# Patient Record
Sex: Male | Born: 1960 | Race: White | Hispanic: No | Marital: Married | Smoking: Former smoker
Health system: Southern US, Community
[De-identification: ages and names within clinical notes are randomized; demographics above are authoritative.]

## PROBLEM LIST (undated history)

## (undated) DIAGNOSIS — M1990 Unspecified osteoarthritis, unspecified site: Secondary | ICD-10-CM

## (undated) DIAGNOSIS — Z9989 Dependence on other enabling machines and devices: Secondary | ICD-10-CM

## (undated) DIAGNOSIS — G4733 Obstructive sleep apnea (adult) (pediatric): Secondary | ICD-10-CM

## (undated) DIAGNOSIS — F99 Mental disorder, not otherwise specified: Secondary | ICD-10-CM

## (undated) DIAGNOSIS — F32A Depression, unspecified: Secondary | ICD-10-CM

## (undated) DIAGNOSIS — K589 Irritable bowel syndrome without diarrhea: Secondary | ICD-10-CM

## (undated) DIAGNOSIS — G7000 Myasthenia gravis without (acute) exacerbation: Secondary | ICD-10-CM

## (undated) DIAGNOSIS — I10 Essential (primary) hypertension: Secondary | ICD-10-CM

## (undated) DIAGNOSIS — R5382 Chronic fatigue, unspecified: Secondary | ICD-10-CM

## (undated) DIAGNOSIS — M109 Gout, unspecified: Secondary | ICD-10-CM

## (undated) DIAGNOSIS — F329 Major depressive disorder, single episode, unspecified: Secondary | ICD-10-CM

## (undated) DIAGNOSIS — R413 Other amnesia: Secondary | ICD-10-CM

## (undated) DIAGNOSIS — K219 Gastro-esophageal reflux disease without esophagitis: Secondary | ICD-10-CM

## (undated) HISTORY — PX: CARPAL TUNNEL RELEASE: SHX101

## (undated) HISTORY — PX: OTHER SURGICAL HISTORY: SHX169

## (undated) HISTORY — DX: Chronic fatigue, unspecified: R53.82

## (undated) HISTORY — DX: Essential (primary) hypertension: I10

## (undated) HISTORY — DX: Depression, unspecified: F32.A

## (undated) HISTORY — DX: Other amnesia: R41.3

## (undated) HISTORY — DX: Unspecified osteoarthritis, unspecified site: M19.90

## (undated) HISTORY — DX: Irritable bowel syndrome, unspecified: K58.9

## (undated) HISTORY — DX: Mental disorder, not otherwise specified: F99

## (undated) HISTORY — DX: Gastro-esophageal reflux disease without esophagitis: K21.9

## (undated) HISTORY — DX: Major depressive disorder, single episode, unspecified: F32.9

---

## 1999-02-16 ENCOUNTER — Ambulatory Visit: Admission: RE | Admit: 1999-02-16 | Discharge: 1999-02-16 | Payer: Self-pay | Admitting: General Practice

## 2001-02-06 ENCOUNTER — Emergency Department (HOSPITAL_COMMUNITY): Admission: EM | Admit: 2001-02-06 | Discharge: 2001-02-06 | Payer: Self-pay | Admitting: Emergency Medicine

## 2001-02-06 ENCOUNTER — Encounter: Payer: Self-pay | Admitting: Emergency Medicine

## 2001-08-20 ENCOUNTER — Encounter: Admission: RE | Admit: 2001-08-20 | Discharge: 2001-11-18 | Payer: Self-pay | Admitting: General Practice

## 2001-09-24 ENCOUNTER — Ambulatory Visit (HOSPITAL_BASED_OUTPATIENT_CLINIC_OR_DEPARTMENT_OTHER): Admission: RE | Admit: 2001-09-24 | Discharge: 2001-09-24 | Payer: Self-pay | Admitting: Orthopedic Surgery

## 2001-09-28 ENCOUNTER — Emergency Department (HOSPITAL_COMMUNITY): Admission: EM | Admit: 2001-09-28 | Discharge: 2001-09-29 | Payer: Self-pay | Admitting: Emergency Medicine

## 2001-09-29 ENCOUNTER — Encounter: Payer: Self-pay | Admitting: Orthopedic Surgery

## 2002-12-04 ENCOUNTER — Encounter: Payer: Self-pay | Admitting: Orthopedic Surgery

## 2002-12-09 ENCOUNTER — Inpatient Hospital Stay (HOSPITAL_COMMUNITY): Admission: RE | Admit: 2002-12-09 | Discharge: 2002-12-12 | Payer: Self-pay | Admitting: Orthopedic Surgery

## 2004-09-09 ENCOUNTER — Inpatient Hospital Stay (HOSPITAL_COMMUNITY): Admission: EM | Admit: 2004-09-09 | Discharge: 2004-09-12 | Payer: Self-pay | Admitting: Emergency Medicine

## 2004-10-06 ENCOUNTER — Observation Stay (HOSPITAL_COMMUNITY): Admission: EM | Admit: 2004-10-06 | Discharge: 2004-10-07 | Payer: Self-pay | Admitting: Emergency Medicine

## 2010-01-25 ENCOUNTER — Observation Stay (HOSPITAL_COMMUNITY): Admission: EM | Admit: 2010-01-25 | Discharge: 2010-01-27 | Payer: Self-pay | Admitting: Emergency Medicine

## 2010-01-26 ENCOUNTER — Ambulatory Visit: Payer: Self-pay | Admitting: Cardiology

## 2010-01-26 ENCOUNTER — Encounter (INDEPENDENT_AMBULATORY_CARE_PROVIDER_SITE_OTHER): Payer: Self-pay | Admitting: Emergency Medicine

## 2010-03-23 ENCOUNTER — Encounter: Admission: RE | Admit: 2010-03-23 | Discharge: 2010-03-23 | Payer: Self-pay | Admitting: *Deleted

## 2010-09-08 LAB — PROTIME-INR
INR: 0.98 (ref 0.00–1.49)
Prothrombin Time: 13.2 seconds (ref 11.6–15.2)

## 2010-09-08 LAB — CK TOTAL AND CKMB (NOT AT ARMC)
CK, MB: 4 ng/mL (ref 0.3–4.0)
CK, MB: 4 ng/mL (ref 0.3–4.0)
CK, MB: 4.2 ng/mL — ABNORMAL HIGH (ref 0.3–4.0)
Relative Index: 2 (ref 0.0–2.5)
Relative Index: 2.3 (ref 0.0–2.5)
Total CK: 209 U/L (ref 7–232)

## 2010-09-08 LAB — POCT CARDIAC MARKERS
CKMB, poc: 3.1 ng/mL (ref 1.0–8.0)
Myoglobin, poc: 121 ng/mL (ref 12–200)
Troponin i, poc: <0.05 ng/mL (ref 0.00–0.09)
Troponin i, poc: <0.05 ng/mL (ref 0.00–0.09)

## 2010-09-08 LAB — DIFFERENTIAL
Basophils Relative: 0 % (ref 0–1)
Lymphocytes Relative: 13 % (ref 12–46)
Monocytes Absolute: 1 10*3/uL (ref 0.1–1.0)
Monocytes Relative: 7 % (ref 3–12)
Neutro Abs: 10.9 10*3/uL — ABNORMAL HIGH (ref 1.7–7.7)
Neutrophils Relative %: 80 % — ABNORMAL HIGH (ref 43–77)

## 2010-09-08 LAB — BASIC METABOLIC PANEL
BUN: 18 mg/dL (ref 6–23)
Calcium: 8.6 mg/dL (ref 8.4–10.5)
Chloride: 106 mEq/L (ref 96–112)
GFR calc non Af Amer: >60 mL/min (ref >60)
Glucose, Bld: 90 mg/dL (ref 70–99)

## 2010-09-08 LAB — CBC
HCT: 38.2 % — ABNORMAL LOW (ref 39.0–52.0)
Hemoglobin: 13 g/dL (ref 13.0–17.0)
MCH: 26 pg (ref 26.0–34.0)
MCH: 27.3 pg (ref 26.0–34.0)
MCHC: 31.9 g/dL (ref 30.0–36.0)
MCHC: 34 g/dL (ref 30.0–36.0)
MCV: 80.1 fL (ref 78.0–100.0)
Platelets: 161 10*3/uL (ref 150–400)

## 2010-09-08 LAB — COMPREHENSIVE METABOLIC PANEL
AST: 28 U/L (ref 0–37)
Alkaline Phosphatase: 63 U/L (ref 39–117)
CO2: 24 mEq/L (ref 19–32)
Calcium: 8.9 mg/dL (ref 8.4–10.5)
Chloride: 108 mEq/L (ref 96–112)
Creatinine, Ser: 0.99 mg/dL (ref 0.4–1.5)
GFR calc Af Amer: >60 mL/min (ref >60)
Glucose, Bld: 93 mg/dL (ref 70–99)
Total Bilirubin: 0.7 mg/dL (ref 0.3–1.2)

## 2010-09-08 LAB — URINALYSIS, ROUTINE W REFLEX MICROSCOPIC
Hgb urine dipstick: NEGATIVE
Specific Gravity, Urine: 1.022 (ref 1.005–1.030)
pH: 6 (ref 5.0–8.0)

## 2010-09-08 LAB — LIPASE, BLOOD: Lipase: 24 U/L (ref 11–59)

## 2010-09-08 LAB — D-DIMER, QUANTITATIVE: D-Dimer, Quant: 0.29 ug/mL-FEU (ref 0.00–0.48)

## 2010-11-10 NOTE — Op Note (Signed)
NAME:  Anthony Carlson, Anthony Carlson                            ACCOUNT NO.:  [id number]   MEDICAL RECORD NO.:  [id number]                   PATIENT TYPE:  INP   LOCATION:  5024                                 FACILITY:  [facility]   PHYSICIAN:  Harvie Junior, M.D.                DATE OF BIRTH:  June 11, 1961   DATE OF PROCEDURE:  DATE OF DISCHARGE:                                 OPERATIVE REPORT   PREOPERATIVE DIAGNOSIS:  End stage degenerative joint disease, right ankle.   POSTOPERATIVE DIAGNOSIS:  End stage degenerative joint disease, right ankle.   OPERATION PERFORMED:  Fusion of right ankle with a lateral exposure, bone  grafting and plating laterally.   SURGEON:  Harvie Junior, M.D.   ASSISTANT:  Marshia Ly, P.A.   ANESTHESIA:  Epidural and general.   INDICATIONS FOR PROCEDURE:  Mr. Spiers is a 50 year old male with a long  history of having significant degenerative joint disease in the right ankle.  He had undergone arthroscopic debridement of this situation and had done  well for a reasonable period of time.  He suffered a medial malleolar  fracture and this ultimately gave way to more significant degenerative  disease of the ankle.  Because of uncontrolled pain in the ankle, the  patient was taken to the operating room for fusion of the ankle.   DESCRIPTION OF PROCEDURE:  The patient was taken to the operating room and  after adequate anesthesia was obtained with epidural anesthetic, the patient  was placed supine on the operating table.  The right leg was prepped and  draped in the usual sterile fashion.  Following this, attention was turned  to the right lower extremity where a lateral incision was made.  Subcutaneous tissue dissected down to the level of fibula.  The fibula was  then cut and longitudinally bisected.  This gave exposure to the lateral  aspect of the joint.  The fibula was maintained distally. The fibular  osteotomy I should say, was performed and then the longitudinal  split of the  fibula was undertaken giving it lateral exposure to the joint.  At this  point the joint was exposed laterally and a saw was used to cut the tibia  perpendicular to the long axis. This was 360 degrees of the tibia, was cut  in this fashion.  This was down through the sclerotic layer of bone and  articular cartilage was harvested at this point.  Attention was turned to  the medial malleolus where all of the articular cartilage was removed from  the medial malleolus.  Attention was turned to the talus.  The talus was  then put up dorsiflexed to 90 degrees and the flat portion of the talus was  then cut perpendicular to the surface of the talus and parallel to the  tibial axis.  Following this, a medial exposure was made for debridement of  the  medial gutter.  Articular cartilage was removed from the medial  malleolus as well as the medial aspect of the talus.  Following this,  attention was turned back laterally where the cartilage was removed from the  lateral aspect of the talus and the bur was used to bur into the cancellous  bone area or into the cortical bone area of the tibia.  At this point the  ankle was held in a reduced position under fluoroscopic imaging and two 7.2  mm cannulated screws were advanced across the fusion site and excellent  fixation was achieved with the cannulated screws.  The bone plate was then  advanced up laterally, held in place and held to the tibia with cortical  small fragment screws.  Bone graft was then placed laterally along the  fibular plate as well as medially in the medial malleolus.  Fluoroscopic  imaging was again used and excellent final flat fixation had been achieved  neutral dorsiflexion, 5 degrees of valgus alignment and slight external  rotation.  At this point the wounds were copiously irrigated and suctioned  dry.  The lateral wound was closed over a suction drain.  The medial wound  was closed with  a stapler and the puncture  wounds for the cannulated screws were closed with  a stapler.  The patient was then placed in a U and a posterior splint and  then taken to the recovery room where he was noted to be in satisfactory  condition.  The estimated blood loss for this procedure was .                                                Harvie Junior, M.D.    Ranae Plumber  D:  12/09/2002  T:  12/10/2002  Job:  [id number]

## 2010-11-10 NOTE — Discharge Summary (Signed)
NAME:  Anthony Carlson, Anthony Carlson                ACCOUNT NO.:  [id number]   MEDICAL RECORD NO.:  [id number]          PATIENT TYPE:  INP   LOCATION:  2023                         FACILITY:  [facility]   PHYSICIAN:  Cristy Hilts. Jacinto Halim, MD       DATE OF BIRTH:  [birth year]   DATE OF ADMISSION:  02/10/2005  DATE OF DISCHARGE:  09/12/2004                                 DISCHARGE SUMMARY   ADMISSION DIAGNOSES:  1.  Chest pain.  2.  Hypertension.  3.  Obesity.   DISCHARGE DIAGNOSES:  1.  Chest pain, false positive Cardiolite and normal coronary      catheterization.  2.  Hypertension.  3.  Dyslipidemia.  4.  Gastroesophageal reflux disease.  5.  End-stage degenerative joint disease with right ankle fusion.   PROCEDURE:  Cardiolite study and cardiac catheterization.   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  He was seen in  the emergency room on the evening of September 10, 2004 and was admitted to rule  out MI.   For further history and physical, please see the dictated note.   PAST MEDICAL HISTORY:  1.  Right ankle fusion and trauma.  2.  Hypertension.  3.  Dyslipidemia.  4.  Gastroesophageal reflux disease.   ALLERGIES:  NONE.   MEDICATIONS:  1.  TriCor 160 mg daily.  2.  Crestor 10 mg daily.  3.  Aspirin 81 mg daily.  4.  Hyzaar 100/25 one daily.  5.  Nexium 40 mg daily.   FAMILY HISTORY:  Positive for hypertension, diabetes and coronary artery  disease.   HOSPITAL COURSE:  The patient was admitted.  He had no further chest pain.  His EKG showed 1 mm ST changes in the inferior leads.  He underwent a  Cardiolite study which showed subtle areas of reversibility at the apex; it  was of questionable significance.  No evidence of a fixed defect to suggest  prior  infarct.   After this study, the patient was scheduled and taken to the cath lab on  September 11, 2004.  At this time, he was found to have a normal left  ventricular systolic pressure with ejection fraction between 65-70%.  Coronary arteries were normal. There was moderate elevation in the left  ventricular end-diastolic pressure, suggestive of some hypertension with a  hypertensive heart.  It was Dr. Verl Dicker opinion at that time that his chest  pain was secondary to gastroesophageal reflux disease.  At that point, the  patient was returned to the floor.  He has remained stable overnight.  He  has developed flu like symptoms, has been started on Tussionex, Tamiflu.  He  had Lopressor 25 mg q.i.d. started after his cath, he has tolerated this,  and has been increased to Toprol  XL 100 mg daily.  His current blood  pressure is stable.  His  telemetry strip showed some episodes of bigeminy  and PVCs.  Currently, his heart rate is in the 70s on current dose of Toprol  XL.   Because of his flu symptoms, he was placed on Tussionex 5-10 mL q.6h. and  Tamiflu 75 mg 1 b.i.d. for 10 days.  He will follow-up with Dr. Jacinto Halim in 2  weeks.   DISCHARGE MEDICATIONS:  1.  Aspirin 81 mg daily.  2.  TriCor 160 mg daily.  3.  Hyzaar 100/25 one daily.  4.  Toprol  XL 100 mg daily.  If his heart rate goes to 60 or below, he is      to decrease this to half.  5.  Nexium 40 mg daily.  6.  Crestor 10 mg daily.  7.  Tussionex 5 mL q.6h. p.r.n. for cough.  8.  Tamiflu 75 mg b.i.d.   DISCHARGE ACTIVITIES:  Light to moderate for 72 hours.  He will resume his  normal activity after that.   DISCHARGE DIET:  Low fat.   He is to call if he has any problems with his cath site.      WDJ/MEDQ  D:  09/12/2004  T:  09/12/2004  Job:  [id number]   cc:   Domenica Fail  [address] [place].  [place]  [place] [address]  Fax: [phone]
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
## 2010-11-10 NOTE — Cardiovascular Report (Signed)
NAME:  Anthony Carlson                ACCOUNT NO.:  [id number]   MEDICAL RECORD NO.:  [id number]          PATIENT TYPE:  INP   LOCATION:  2023                         FACILITY:  [facility]   PHYSICIAN:  Cristy Hilts. Jacinto Halim, MD       DATE OF BIRTH:  Dec 27, 1960   DATE OF PROCEDURE:  09/11/2004  DATE OF DISCHARGE:                              CARDIAC CATHETERIZATION   PROCEDURE PERFORMED:  1.  Left ventriculography.  2.  Selective right and left coronary arteriography.  3.  Ascending aortogram.  4.  Abdominal aortogram.   INDICATION:  [name] is a pleasant 50 year old gentleman with a  history of hypertension, hyperlipidemia, obesity who had presented to the  emergency room complaining of chest discomfort.  Because of his multiple  cardiac risk factors, he had undergone cardiac stress testing which had  revealed apical ischemia and anterior wall thinning.  Given EKG changes with  Persantine Cardiolite, a cardiac catheterization is being performed to  evaluate for coronary artery disease.  An ascending aortogram was performed  to evaluate for an ascending aortic aneurysm and aortic dissection and  abdominal aortogram was performed to evaluate for renal artery stenosis as  he had significant hypotension.   HEMODYNAMIC DATA:  The left ventricular pressures was 149/4 with an end-  diastolic pressure of 26 mmHg.  The aortic pressure was 138/90 with a mean  of 110 mmHg.  There was a pressure gradient of 10 mmHg.   ASCENDING AORTOGRAM:  Ascending aortogram revealed presence of three aortic  valve cusps.  There is no evidence of aortic DISSECTION OR Regurgitation.  There was no evidence of aortic dissection.   ABDOMINAL AORTOGRAM:  Abdominal aortogram revealed the presence of two renal  arteries, one on either side, that were widely patent.  There was no  evidence of renal artery stenosis.   Left ventricle.  Left ventricular systolic function was normal to super  normal with ejection fraction  estimated around 70%.  There was no  significant mitral regurgitation.   Right coronary artery.  Right coronary artery is a large caliber vessel and  a dominant vessel.  It gives rise into a large PLA and a moderate sized PDA.  They are normal.   Left main coronary artery.  The left main coronary artery is a large caliber  vessel.  It is normal.   Circumflex.  Circumflex is a large caliber vessel.  It gives rise into a  moderate to large sized OM-1 and continues as a large OM-2 after giving an  AV groove branch.  The circumflex is normal.   Ramus intermedius.  The ramus intermedius is a large caliber vessel.  It is  normal.   Left anterior descending artery.  The left anterior descending artery is a  large caliber vessel.  It gives rise into a large diagonal-1.  It ends at  the apex.  It is normal.   IMPRESSIONS:  1.  Normal left ventricular systolic pressure, ejection fractions at 65-70%.      Dynamic left ventricle.  2.  Normal coronary arteries.  3.  Moderate elevation in left ventricular end-diastolic pressure suggestive      of hypertension with hypertensive heart disease.  4.  Chest pain is probably secondary to gastroesophageal reflux disease but      cannot exclude subendocardial ischemia secondary to hypertensive heart      disease.   RECOMMENDATION:  Aggressive risk factor modification is indicated.  Better  control of blood pressure is indicated.  Patient has been started on  metoprolol 25 mg p.o. q.6 hours and he will be switched over to Toprol XL  100 mg p.o. daily.  He will also be controlled on preprocedure  antihypertensive medications.  His HDL is not at goal.  Would add fish oil  capsules or Omacor one capsule b.i.d. to see if this would have an effect on  increasing the HDL.  Consideration can also be given for adding Niaspan  instead of Tricor.  Further recommendations will follow.   TECHNIQUE AND PROCEDURE:  Under the usual sterile precautions, using a 6   French right femoral artery access, a 6 [place] multipurpose B2 catheter was  advanced into the ascending aorta with a 0.025 J wire.  The catheter was  gently advanced into the left ventricle, left ventricular pressures were  monitored.  Hand contrast injection of left ventricle was performed both in  the LAO and RAO projections.  The catheter was flushed with saline, pulled  back into the ascending aorta and pressure gradient across the aortic valve  was monitored.  Right coronary was selectively engaged, angiography was  performed.  Then ascending aortogram was performed with visualization of the  root in the LAO projection.  Then the catheter was pulled back in the  abdominal aorta and an abdominal aortogram was performed.  Then the catheter  was pulled out of the body in the usual fashion.  A 6 French Judkins LeFort  diagnostic catheter was utilized to engage the left main coronary artery and  angiography was repeated.  Then the catheter was pulled out of the body in  the usual fashion.  A right femoral angiography was performed through the  arterial access sheath and the axis was closed with Angio-Seal with  excellent hemostasis obtained.  The patient tolerated the procedure well.  A  total of 125 mL of contrast was utilized for diagnostic catheter.      JRG/MEDQ  D:  09/11/2004  T:  09/11/2004  Job:  [id number]   cc:   Domenica Fail, M.D.  [facility]

## 2010-11-10 NOTE — Discharge Summary (Signed)
NAME:  Anthony Carlson, Anthony Carlson                ACCOUNT NO.:  [id number]   MEDICAL RECORD NO.:  [id number]          PATIENT TYPE:  INP   LOCATION:  5003                         FACILITY:  [facility]   PHYSICIAN:  Corinna L. Lendell Caprice, MD     DATE OF BIRTH:   DATE OF ADMISSION:  10/06/2004  DATE OF DISCHARGE:  10/07/2004                                 DISCHARGE SUMMARY   DIAGNOSES:  1.  Xanax overdose.  2.  Depression.  3.  Hyperlipidemia.  4.  Hypertension.  5.  Gastroesophageal reflux disease.  6.  Obstructive sleep apnea.  7.  Anxiety.   MEDICATIONS AT TIME OF TRANSFER:  1.  Aspirin 81 mg a day.  2.  Protonix 40 mg a day.   HOME MEDICATIONS:  1.  Hyzaar 12.5 mg a day.  2.  Nexium 40 mg a day.  3.  Avodart 300 mg a day.  4.  Toprol-XL 100 mg a day.  5.  Voltaren 75 mg a day.  6.  Tricor 145 mg a day.  7.  Crestor 10 mg a day.  8.  Aspirin 81 mg a day.   CONDITION ON DISCHARGE:  Stable.   CONSULTATIONS:  Dr. Dub Mikes.   DISCHARGE DIET:  Low-salt, low-cholesterol.   PERTINENT LABORATORY STUDIES:  UA essentially negative. Urine drug screen  positive for benzodiazepine and barbiturates. Blood alcohol less than 5.  Acetaminophen level on 4/14 at 1816 was 51. At 2039, his acetaminophen level  was 21. Salicylate level less than 4. Complete metabolic panel significant  for a potassium of 3.4, otherwise unremarkable. CBC unremarkable.   SPECIAL STUDIES AND RADIOLOGY:  EKG shows normal sinus rhythm. Chest x-ray  shows borderline heart size, nothing acute.   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 He also took two tablets of Bupap for headache.  Apparently, there was some confusion as to what he had ingested and there  was concern that he had taken an overdose of an acetaminophen-containing  medication. Poison control was called and initially patient was started on  a  Mucomyst drip. However, due to levels, they recommended that this be stop as  he did not have evidence of acetaminophen toxicity. The patient was very  groggy initially and was unable to give much history. The history that he  was able to give was quite conflicting. Please see H&P for further details.  The patient was admitted to the intensive care unit where he had stable  vital signs. His somnolence improved and at the time of discharge, he was  alert and oriented, and had normal vital signs. He did admit to taking 10 to  15 of his wife's Xanax pills and two of the Bupap. Dr. Dub Mikes was consulted  and felt that short-term inpatient psychiatric stay would be beneficial. The  patient is willing to sign voluntarily to admission to [facility]. At the time of transfer, he is medically stable. Total time  on the  day of discharge is 40 minutes.      CLS/MEDQ  D:  10/07/2004  T:  10/07/2004  Job:  [id number]

## 2010-11-10 NOTE — Op Note (Signed)
   NAME:  Fussner, Emrik                            ACCOUNT NO.:  [id number]   MEDICAL RECORD NO.:  [id number]                   PATIENT TYPE:  INP   LOCATION:  5024                                 FACILITY:  [facility]   PHYSICIAN:  Judie Petit, M.D.              DATE OF BIRTH:  10/27/1960   DATE OF PROCEDURE:  12/09/2002  DATE OF DISCHARGE:                                 OPERATIVE REPORT   PROCEDURE:  Lumbar epidural placement.   INDICATIONS FOR PROCEDURE:  Mr. Hinnenkamp is a 50 year old male who presents  today for right ankle fusion by Dr. Harvie Junior.  The patient requested  epidural anesthesia for the procedure and for postoperative pain relief.  Prior to the procedure, in the preop holding area, the risks and benefits of  epidural anesthesia were discussed with the patient in detail; these risks  included possibility of a spinal, spinal headache, infection, nerve damage,  numbness, worsening pain, bleeding, etc.  The patient understood these risks  and agreed to proceed with the procedure.  In the preop holding area, the  patient understood that the epidural would be placed there.   DESCRIPTION OF PROCEDURE:  In the preop holding area, after monitors were  placed and consent verified, the patient was placed in the sitting position.  The lower back was aseptically prepped and draped with Betadine.  The L3-4  interspace was entered in the midline using a 17-gauge, two-way needle.  On  the first pass, the epidural space was entered with loss-of-resistance  technique with preservative-free normal saline.  The skin had been localized  with 5 mL of 1% Xylocaine with a 25-gauge needle.  There was no heme or CSF  aspiration.  The catheter was threaded 3 cm into the epidural space without  difficulty.  The needle was withdrawn without difficulty.  __________ was  negative with 3 mL of 2% Xylocaine with epinephrine 1:200,000.  The catheter  was taped to the patient's back with Hypafix tape.   After the ___________,  the patient was placed in the supine position.  Vital signs were stable, and  the patient was subsequently taken to the operating room for the procedure.  The patient tolerated the procedure well without complications and will be  followed by the anesthesia team for postoperative pain relief as well.                                               Judie Petit, M.D.    CE/MEDQ  D:  12/09/2002  T:  12/10/2002  Job:  [id number]

## 2010-11-10 NOTE — Discharge Summary (Signed)
NAME:  Anthony Carlson, Anthony Carlson                            ACCOUNT NO.:  [id number]   MEDICAL RECORD NO.:  [id number]                   PATIENT TYPE:  INP   LOCATION:  5024                                 FACILITY:  [facility]   PHYSICIAN:  Harvie Junior, M.D.                DATE OF BIRTH:  Nov 13, 1960   DATE OF ADMISSION:  12/09/2002  DATE OF DISCHARGE:  12/12/2002                                 DISCHARGE SUMMARY   ADMITTING DIAGNOSIS:  End-stage degenerative joint disease right ankle.   DISCHARGE DIAGNOSIS:  End-stage degenerative joint disease right ankle.   PROCEDURES IN THE HOSPITAL:  Right ankle tibiotalar fusion, Jodi Geralds,  M.D.   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  He ultimately had a  right ankle arthroscopy in April of 2003 which showed marked degenerative  changes.  He got temporary relief with this.  He had a couple of cortisone  injections in his ankle which gave temporary relief.  X-rays showed bone-on-  bone in the tibiotalar joint of his right ankle.  He developed severe night  pain and pain with ambulation which required pain medication, and based upon  his clinical and radiographic findings he was felt to be a candidate for a  right ankle tibiotalar fusion.  He is admitted for this.   PERTINENT LABORATORY STUDIES:  Hemoglobin on admission was 13.6, hematocrit  38.6.  Indices within normal limits.  On postop day number one, his  hemoglobin was 12.4 with a WBC count of 9.8.  On postop day number two, his  hemoglobin was 11.1.  His Pro Time on admission was 11.9 seconds with an INR  of 0.8, and his PTT was 30.  His CMET was entirely within normal limits.  Urinalysis showed no abnormalities.   His EKG on admission showed normal EKG, normal sinus rhythm.   HOSPITAL COURSE:  The patient underwent right ankle tibiotalar fusion as  well described in Dr. Luiz Blare' operative note.  He had an  epidural Fentanyl  catheter placed at the time of surgery for pain control postoperatively.  IV  Ancef was given 1 gram q.8 hours x5 doses.  The leg was elevated and  Physical Therapy saw the patient for walker ambulation, nonweightbearing on  the right.  On postop day number one, he had moderate ankle pain, he was  taking fluids without difficulty, he had a low-grade fever of 100.6, his  vital signs were stable.  His posterior splint was intact to the right lower  extremity, he moves his toes actively, and his hemoglobin was 12.4.  He has  gotten out of bed to the chair with Physical Therapy.  He was continued with  the epidural and IV fluids as well as IV antibiotics.  We also started him  on oral Coumadin.  On  postop day number two, he had ambulated 10 feet with  the walker, he had no complaints, had a fever of 101.3, and his INR was 1.0.  The Hemovac was pulled at that time.  The epidural was then discontinued and  oral pain medication was ordered.  The patient was then discharged on December 12, 2002, postop day number three.  He was afebrile and his vital signs were  stable, his splint was in good repair and he moved his toes actively.  He  was discharged home in improved condition.  He was on a regular diet.  He  was given an Rx for Coumadin to use x3-4 weeks postop for DVT prophylaxis.  This will be followed by Dr. Louanna Raw at [facility].  He also was to use pain medications,  which he already had at home including OxyContin and Lortab for breakthrough  pain.  His activity status will be nonweightbearing on the right with a  walker or crutches.  He will elevate the leg as much as possible.  He will  follow up with Dr. Luiz Blare in 10 days.      Marshia Ly, P.A.                       Harvie Junior, M.D.    Cordelia Pen  D:  02/05/2003  T:  02/06/2003  Job:  [id number]   cc:   Louanna Raw  [id number]  [place]  [place] [address]  Fax: [phone]

## 2010-11-10 NOTE — Discharge Summary (Signed)
NAME:  Anthony Carlson, Anthony Carlson                ACCOUNT NO.:  [id number]   MEDICAL RECORD NO.:  [id number]          PATIENT TYPE:  INP   LOCATION:  5003                         FACILITY:  [facility]   PHYSICIAN:  Corinna L. Lendell Caprice, MDDATE OF BIRTH:  03-15-1961   DATE OF ADMISSION:  10/06/2004  DATE OF DISCHARGE:                                 DISCHARGE SUMMARY   ADDENDUM:  This is an addendum to the previously dictated transfer summary.  I have just been notified by Dr. Dub Mikes that the plan for transfer to  inpatient psychiatry has been changed. The patient prefers to seek  outpatient care and Dr. Dub Mikes feels that this is appropriate and that he is  able to contract for safety and that he does not in fact need to be  committed to the psychiatric unit. He is therefore being discharged to home,  medications per Dr. Dub Mikes,  follow up per Dr. Dub Mikes.      CLS/MEDQ  D:  10/07/2004  T:  10/07/2004  Job:  [id number]

## 2010-11-10 NOTE — H&P (Signed)
NAMETORAN, MURCH NO.:  [id number]   MEDICAL RECORD NO.:  [id number]          PATIENT TYPE:  EMS   LOCATION:  MAJO                         FACILITY:  [facility]   PHYSICIAN:  Hollice Espy, M.D.DATE OF BIRTH:  Jan 27, 1961   DATE OF ADMISSION:  10/06/2004  DATE OF DISCHARGE:                                HISTORY & PHYSICAL   CHIEF COMPLAINT:  Overdose.   HISTORY OF PRESENT ILLNESS:  The patient is a 50 year old white male with a  past medical history of hypertension, anxiety, hyperlipidemia, and GERD, who  underwent a cardiac catheterization for chest pain approximately 3-4 weeks  ago.  At that time, catheterization was felt to be completely normal but it  was felt that he may have some underlying heart disease secondary to  hypertension and was started on a beta blocker.  Since that time secondary  to the beta blocker, he has had problems with side effects including  decreased energy, depression, and sexual dysfunction.  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  He came  back extremely lethargic and went to bed.  This morning, he woke up and at  sometime, the details are vague, but apparently the patient took more  medication.  He took either 2 or 4 pills of Bupap which is a combination of  a barbiturate and acetaminophen drug.  He then went to a [employer]  and was quite lethargic and easily agitated.  He tried to make an order at  [employer] but was unable, the staff did not understand what he was  saying.  The patient got upset and pulled out a gun.  He was then arrested.  His wife was called and the patient was bailed out and brought home.  Here  is where it becomes extremely  unclear.  The patient either prior to these  incidents occurring today or after, took an unknown number of pills, whether  they be Bupap together or Tylenol alone.  He, reportedly, took as many as  100, or perhaps 50, or perhaps only a handful.  The patient was extremely  lethargic and then passed out on the floor.  The wife became quite concerned  and called EMS.  The husband also, apparently prior to becoming  unconsciousness, told the wife that he took 50 hydrocodone versus Vicodin.  The patient was brought into the emergency room.  At that time, he was  extremely drowsy but arousable.  Labs were immediately ordered and checked  on the patient and he received Narcan.  The patient's acetaminophen level at  6 p.m. was noted to be 51.  The rest of his lab work was significant for a  urine drug screen which was negative for opiates, however, positive for  benzo and barbiturates.  Alcohol and salicylate  levels were normal.  Urinalysis was noted to have a small amount of bilirubin, but otherwise,  normal, as well.  The patient's ISTAT labs were normal, as well.  After  initial discussion, there was some question as to what exactly occurred.  The EMS related some events of what happened the day of admission events to  the nursing staff who entered it in the computer as the patient was possible  homicidal versus suicidal.  After extension discussion with the wife, I was  able to relate the history as above.  I then spent some time talking with  the patient.  He tells me initially that he only took 4 Ativan yesterday and  today took a few Bupap and many Tylenol.  However, later on he changed his  story and said he took 39 Lortab.  At this point, the patient is still quite  drowsy but arousable.  After reviewing the patient's Tylenol level and  getting a time line according to the wife, I spoke with poison control and  they told me with a level of 50 with a nine hour time frame being  conservative  as the patient took the medications as early as 9 a.m. which  wife says it could not have been any earlier than that, the patient is  actually stable and does not need the Mucomyst protocol.  In addition, the  Phenobarbital in this case does not need any other measures other than  supportive care and IV fluids.  Initially, we had spoken and review of the  literature and discussion with poison control barbiturate overdose can put  the patient at risk for bullous lesions, pneumonia, respiratory depression,  CNS depression, and hypoglycemia.  Currently, the patient is extremely  drowsy but he is easily arousable.  He denies any complaints of hurting  anywhere and would like to go to sleep.  He specifically denies any  complaints of chest pain, shortness of breath, abdominal pain, pain in his  arms or legs.  I am not able to get more review of systems other than that  secondary to his extreme drowsiness.   PAST MEDICAL HISTORY:  Hyperlipidemia, hypertension, GERD, obesity, sleep  apnea, recent cardiac catheterization which was found to be normal, anxiety.   MEDICATIONS:  According to the patient, he is on CPAP although the wife does  not know the settings, Voltaren 75 p.o. b.i.d., Crestor p.o. q.h.s., Nexium  40 p.o. daily, Tricor 145 daily, Toprol 100 mg p.o. daily.  He was  previously on Hyzaar as per the discharge summary from a few weeks ago,  however, he did not mention that nor did his wife in his medication list.   ALLERGIES:  He has no known drug allergies.   SOCIAL HISTORY:  He denies any tobacco, alcohol, or drug use.  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   FAMILY HISTORY:  Hypertension.   PHYSICAL EXAMINATION:  VITAL SIGNS:  Temperature 97.8, heart rate 79, blood pressure 128/73,  respirations 16, O2 saturation 95%.  HEENT:  Normocephalic, atraumatic, mucous membranes dry.  He has a very narrow airway given his obesity.  HEART:  Regular rate and  rhythm, S1 and S2.  LUNGS:  Clear to auscultation bilaterally.  ABDOMEN:  Soft, obese, nontender, positive bowel sounds.  EXTREMITIES:  No cyanosis, clubbing, and edema.   LABORATORY DATA:  Tylenol level drawn at 6 p.m. is 51, unknown time of  ingestion but this level drawn could  not be more than 9 hours post  absorption.  White count 7.6, hemoglobin 13.1, hematocrit 37.2, MCV 83,  platelet count 278, 68 neutrophils.  Urine drug screen positive for benzo  and barbiturates.  Alcohol level less than 5.  Salicylate level less than 4.  Urinalysis showed small amount of bilirubin, otherwise, normal.  Creatinine  1.2, sodium 141, potassium, 3.4, chloride 108, bicarb 23, BUN 16, glucose  92.  PH 7.46, pCO2 31.  The second Tylenol level at 9 p.m. is drawn and  pending.   ASSESSMENT AND PLAN:  1.  Overdose.  Put the patient on 24 hour sitter with suicide precautions.      I have spoken with poison control, the barbiturates do not need to be      treated other than supportive care with IV fluids, nevertheless, will      put the patient in ICU setting for monitoring for respiratory      depression, chest x-ray in the morning to check for pneumonia, and check      CBGs q.4h. x 24 hours.  For his acetaminophen level, I spoke with poison      control who said given the levels and the fact that this was less than 9      hours of ingestion and a level of 51, that this is nontoxic and he does      not need the Mucomyst protocol.  He has currently received an initial      dose of Mucomyst, will complete this, and watch the patient.  Will check      LFTs in the morning.  2.  Hypertension.  The patient could be at risk for hypotension, will hold      his Toprol for now.  Given the patient's hyperlipidemia, hold his      Crestor given his involvement with acetaminophen levels for now.   Total time spent on this patient has been at least 95 minutes with  approximately 35 minutes of critical care time.  The  other time was spent in  basic medical treatment, counseling of the patient and his wife.      SKK/MEDQ  D:  10/06/2004  T:  10/06/2004  Job:  [id number]

## 2010-11-21 ENCOUNTER — Other Ambulatory Visit: Payer: Self-pay | Admitting: Orthopedic Surgery

## 2010-11-21 DIAGNOSIS — M79671 Pain in right foot: Secondary | ICD-10-CM

## 2010-11-22 ENCOUNTER — Ambulatory Visit
Admission: RE | Admit: 2010-11-22 | Discharge: 2010-11-22 | Disposition: A | Discharge status: Home / Self Care | Payer: BC Managed Care – PPO | Source: Ambulatory Visit | Attending: Orthopedic Surgery | Admitting: Orthopedic Surgery

## 2010-11-22 DIAGNOSIS — M79671 Pain in right foot: Secondary | ICD-10-CM

## 2015-02-24 DIAGNOSIS — G7000 Myasthenia gravis without (acute) exacerbation: Secondary | ICD-10-CM

## 2015-02-24 HISTORY — DX: Myasthenia gravis without (acute) exacerbation: G70.00

## 2016-02-23 ENCOUNTER — Encounter: Payer: Self-pay | Admitting: *Deleted

## 2016-02-24 ENCOUNTER — Ambulatory Visit (INDEPENDENT_AMBULATORY_CARE_PROVIDER_SITE_OTHER): Payer: BLUE CROSS/BLUE SHIELD | Admitting: Neurology

## 2016-02-24 ENCOUNTER — Encounter: Payer: Self-pay | Admitting: Neurology

## 2016-02-24 ENCOUNTER — Other Ambulatory Visit (INDEPENDENT_AMBULATORY_CARE_PROVIDER_SITE_OTHER): Payer: BLUE CROSS/BLUE SHIELD

## 2016-02-24 VITALS — BP 130/90 | HR 79 | Ht 67.5 in | Wt 328.6 lb

## 2016-02-24 DIAGNOSIS — M48062 Spinal stenosis, lumbar region with neurogenic claudication: Secondary | ICD-10-CM

## 2016-02-24 DIAGNOSIS — G3184 Mild cognitive impairment, so stated: Secondary | ICD-10-CM

## 2016-02-24 DIAGNOSIS — H532 Diplopia: Secondary | ICD-10-CM

## 2016-02-24 DIAGNOSIS — H02401 Unspecified ptosis of right eyelid: Secondary | ICD-10-CM

## 2016-02-24 DIAGNOSIS — H02409 Unspecified ptosis of unspecified eyelid: Secondary | ICD-10-CM | POA: Insufficient documentation

## 2016-02-24 DIAGNOSIS — M4806 Spinal stenosis, lumbar region: Secondary | ICD-10-CM

## 2016-02-24 DIAGNOSIS — R531 Weakness: Secondary | ICD-10-CM

## 2016-02-24 DIAGNOSIS — R0600 Dyspnea, unspecified: Secondary | ICD-10-CM | POA: Diagnosis not present

## 2016-02-24 LAB — COMPREHENSIVE METABOLIC PANEL
ALBUMIN: 3.8 g/dL (ref 3.5–5.2)
ALT: 36 U/L (ref 0–53)
AST: 35 U/L (ref 0–37)
Alkaline Phosphatase: 77 U/L (ref 39–117)
BUN: 14 mg/dL (ref 6–23)
CALCIUM: 8.7 mg/dL (ref 8.4–10.5)
CHLORIDE: 103 meq/L (ref 96–112)
CO2: 34 meq/L — AB (ref 19–32)
Creatinine, Ser: 0.99 mg/dL (ref 0.40–1.50)
GFR: 83.16 mL/min (ref >60.00)
Glucose, Bld: 92 mg/dL (ref 70–99)
POTASSIUM: 3.6 meq/L (ref 3.5–5.1)
Sodium: 143 mEq/L (ref 135–145)
Total Bilirubin: 0.5 mg/dL (ref 0.2–1.2)
Total Protein: 6.4 g/dL (ref 6.0–8.3)

## 2016-02-24 LAB — SEDIMENTATION RATE: Sed Rate: 24 mm/hr — ABNORMAL HIGH (ref 0–20)

## 2016-02-24 LAB — C-REACTIVE PROTEIN: CRP: 0.6 mg/dL (ref 0.5–20.0)

## 2016-02-24 NOTE — Patient Instructions (Addendum)
1.  NCS/EMG of the right side 2.  Check blood work 3.  MRI brain wwo contrast 4.  Neuropsychological testing  We will call you with the results of your testing and decide the next step

## 2016-02-24 NOTE — Progress Notes (Addendum)
[facility] Clinic Note - Initial Visit   Date: 02/24/16  Anthony Carlson MRN: [id number] DOB: May 18, 1961   Dear Dr. Abner Greenspan:  Thank you for your kind referral of Anthony Carlson for consultation of muscle weakness. Although his history is well known to you, please allow [place] to reiterate it for the purpose of our medical record. The patient was accompanied to the clinic by self.    History of Present Illness: Anthony Carlson is a 55 y.o. right-handed Caucasian male with depression, GERD, hypertension, chronic fatigue, OSA on CPAP, and IBS presenting for evaluation of muscle weakness and memory problems.    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  He has noticed greater difficulty over the past few years and is unable to recall the names of medications or names of his patients.  He forgets details of conversation.  He is driving and does not get lost.  He has not noticed any difficulty managing complex decisions at work, managing finances, or medications.    Starting in mid-June, he began having double vision with images on top of each other.  He has noticed that this only occurs when he is focusing on his phone, computer, or reading and he changes his attention to something faraway.  Double vision is resolved if he closes one eye.  Symptoms are worse as the day progresses.  For the past 3 years, he complains of generalized weakness of the arms and legs.  He recalls that he was unable to join his son on a camping trip because his legs felt very weak.  Anytime that he exerts himself, he feels more fatigued. Rest does not help. He has been using a cane over the same time because of imbalance. He endorses chronic shortness of breath and sleeps on 1 pillow.   He has a right ankle fusion and ankle instability of the left foot.  He wears an ankle brace.    He is 90% disabled through the [place].   Out-side paper records, electronic medical record, and images have been  reviewed where available and summarized as:  MRI lumbar spine 03/24/2010: 1.  Lumbar spondylosis and degenerative disc disease, with impingement at L3-4, L4-5, and L5-S1. 2.  1.1 cm left retroperitoneal fluid signal intensity structure could represent venous varix, dilated lymphatic structure, or a mildly enlarged lymph node.  CT head wo contrast 02/07/2001:  NORMAL CT SCAN OF THE HEAD. RIGHT MAXILLARY MUCUS RETENTION CYST VS. POLYP.  Lab 11/2015:  HbA1c 5.7  Past Medical History:  Diagnosis Date  . Arthritis   . Chronic fatigue   . Depression   . GERD (gastroesophageal reflux disease)   . Hypertension   . IBS (irritable bowel syndrome)   . Memory changes   . Mental disorder     Past Surgical History:  Procedure Laterality Date  . ankle fusions    . vasectomy and reversal       Medications:  Outpatient Encounter Prescriptions as of 02/24/2016  Medication Sig  . Atorvastatin Calcium (LIPITOR PO) Take by mouth.  . DICLOFENAC PO Take by mouth.  . doxazosin (CARDURA) 8 MG tablet Take 8 mg by mouth daily.  . Eluxadoline (VIBERZI) 75 MG TABS Take 1 tablet by mouth 2 (two) times daily.  Marland Kitchen GABAPENTIN PO Take by mouth.  . Lisinopril-Hydrochlorothiazide (ZESTORETIC PO) Take by mouth.  . modafinil (PROVIGIL) 200 MG tablet Take 200 mg by mouth daily.  Marland Kitchen OMEPRAZOLE PO Take by mouth.  Marland Kitchen  testosterone cypionate (DEPO-TESTOSTERONE) 200 MG/ML injection Inject into the muscle every 14 (fourteen) days.   No facility-administered encounter medications on file as of 02/24/2016.      Allergies:  Allergies  Allergen Reactions  . Avelox [Moxifloxacin Hcl In Nacl]     Family History: Family History  Problem Relation Age of Onset  . Alcoholism Mother   . Drug abuse Sister   . Drug abuse Sister   . Severe combined immunodeficiency Maternal Uncle   . Suicidality Maternal Uncle     Social History: Social History  Substance Use Topics  . Smoking status: Never Smoker  . Smokeless tobacco:  Never Used  . Alcohol use No   Social History   Social History Narrative   Lives with wife and son in a one story home.   Has 2 children.  Works as a Transport planner.  Education: college.    Review of Systems:  CONSTITUTIONAL: No fevers, chills, night sweats, or weight loss.   EYES: +visual changes or eye pain ENT: No hearing changes.  No history of nose bleeds.   RESPIRATORY: No cough, wheezing and shortness of breath.   CARDIOVASCULAR: Negative for chest pain, and palpitations.   GI: Negative for abdominal discomfort, blood in stools or black stools.  No recent change in bowel habits.   GU:  No history of incontinence.   MUSCLOSKELETAL: +history of joint pain or swelling.  No myalgias.   SKIN: Negative for lesions, rash, and itching.   HEMATOLOGY/ONCOLOGY: Negative for prolonged bleeding, bruising easily, and swollen nodes.  No history of cancer.   ENDOCRINE: Negative for cold or heat intolerance, polydipsia or goiter.   PSYCH:  No depression or anxiety symptoms.   NEURO: As Above.   Vital Signs:  BP 130/90   Pulse 79   Ht 5' 7.5" (1.715 m)   Wt (!) 328 lb 9 oz (149 kg)   SpO2 92%   BMI 50.70 kg/m    General Medical Exam:   General:  Well appearing, comfortable.   Eyes/ENT: see cranial nerve examination.   Neck: No masses appreciated.  Full range of motion without tenderness.  No carotid bruits. Respiratory:  Clear to auscultation, good air entry bilaterally.   Cardiac:  Regular rate and rhythm, no murmur.   Extremities:  No deformities, edema, or skin discoloration.  Skin:  No rashes or lesions.  Neurological Exam: MENTAL STATUS including orientation to time, place, person, recent and remote memory, attention span and concentration, language, and fund of knowledge is normal.  Speech is not dysarthric.  Montreal Cognitive Assessment  02/24/2016  Visuospatial/ Executive (0/5) 5  Naming (0/3) 3  Attention: Read list of digits (0/2) 2  Attention: Read list of letters (0/1) 1    Attention: Serial 7 subtraction starting at 100 (0/3) 3  Language: Repeat phrase (0/2) 2  Language : Fluency (0/1) 1  Abstraction (0/2) 2  Delayed Recall (0/5) 5  Orientation (0/6) 6  Total 30  Adjusted Score (based on education) 30    CRANIAL NERVES: II:  No visual field defects.  Unremarkable fundi.   III-IV-VI: Pupils equal round and reactive to light.  Normal conjugate, extra-ocular eye movements in all directions of gaze.  No nystagmus. Mild right ptosis, without worsening with sustained upgaze.   V:  Normal facial sensation.   VII:  Normal facial symmetry and movements.  Oribicularis oculi, orbicularis oris, frontalis, and buccinator is 5/5. VIII:  Normal hearing and vestibular function.   IX-X:  Normal palatal  movement.   XI:  Normal shoulder shrug and head rotation.   XII:  Normal tongue strength and range of motion, no deviation or fasciculation.  MOTOR:  No atrophy, fasciculations or abnormal movements.  No pronator drift.  Tone is normal.  There is no fatigability on exam.    Right Upper Extremity:    Left Upper Extremity:    Deltoid  5/5   Deltoid  5/5   Biceps  5/5   Biceps  5/5   Triceps  5/5   Triceps  5/5   Wrist extensors  5/5   Wrist extensors  5/5   Wrist flexors  5/5   Wrist flexors  5/5   Finger extensors  5/5   Finger extensors  5/5   Finger flexors  5/5   Finger flexors  5/5   Dorsal interossei  5/5   Dorsal interossei  5/5   Abductor pollicis  5/5   Abductor pollicis  5/5   Tone (Ashworth scale)  0  Tone (Ashworth scale)  0   Right Lower Extremity:    Left Lower Extremity:    Hip flexors  5/5   Hip flexors  5/5   Hip extensors  5/5   Hip extensors  5/5   Knee flexors  5/5   Knee flexors  5/5   Knee extensors  5/5   Knee extensors  5/5   Dorsiflexors  5/5   Dorsiflexors  5/5   Plantarflexors  5/5   Plantarflexors  5/5   Toe extensors  5/5   Toe extensors  5/5         Tone (Ashworth scale)  0  Tone (Ashworth scale)  0   MSRs:  Right                                                                  Left brachioradialis 1+  brachioradialis 1+  biceps 1+  biceps 1+  triceps 1+  triceps 1+  patellar 1+  patellar 1+  ankle jerk Not tested  ankle jerk Not tested  plantar response Not tested  plantar response Not tested  *Lower extremity neurological exam was limited as patient did not wish to remove his shoes  SENSORY:  Normal and symmetric perception of light touch, pinprick, vibration, and proprioception to the ankles. Sensation of the feet could not be tested due to shoes  COORDINATION/GAIT: Normal finger-to- nose-finger.  Intact rapid alternating movements bilaterally.  Unable to rise from a chair without using arms.  Gait is moderately wide-based, unsteady and asymmetric due to leg length discrepancy.   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  With his double vision, right ptosis, and generalized weakness, myasthenia needs to be evaluated for with AChR antibody testing and repetitive nerve stimulation on NCS.  I do not feel that he has any degree of primary muscle disease given preserved muscle bulk.  He is very concerned about the possibility of multiple sclerosis because of his neurological symptoms and my overall suspicion is low given the absence of upper motor neuron signs, but since this can manifest with vision changes and weakness, MRI brain will be ordered.  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 This most certainly is contributing to his leg fatigue, pain, and weakness with exertion.  He does not wish to have surgery because of his experience with failed back syndrome and due to his history of substance abuse, does not want to treat his pain.     Regarding his word-findings difficulty and memory, I do not see signs of dementia and suspect there may be more mild  cognitive impairment or related to mood disorder.  He is complaint with his CPAP for OSA and sleeps well.  PLAN/RECOMMENDATIONS:  1.  Check ESR, CRP, CK, MG panel, CMP 2.  NCS/EMG right arm and leg - MG protocol 3.  MRI brain wwo contrast 4.  Neuropsychological testing  Further recommendations will be based on the results of his testing   The duration of this appointment visit was 40 minutes of face-to-face time with the patient.  Greater than 50% of this time was spent in counseling, explanation of diagnosis, planning of further management, and coordination of care.   Thank you for allowing me to participate in patient's care.  If I can answer any additional questions, I would be pleased to do so.    Sincerely,    Donika K. Posey Pronto, DO

## 2016-02-28 NOTE — Progress Notes (Signed)
Note routed to both. 

## 2016-03-01 LAB — MYASTHENIA GRAVIS PANEL 2
ACETYLCHOLINE REC MOD AB: 7 %{inhibition}
Acetylcholine Rec Binding: <0.3 nmol/L
Aceytlcholine Rec Bloc Ab: <15 % of inhibition (ref <15)

## 2016-03-02 ENCOUNTER — Ambulatory Visit
Admission: RE | Admit: 2016-03-02 | Discharge: 2016-03-02 | Disposition: A | Discharge status: Home / Self Care | Payer: BLUE CROSS/BLUE SHIELD | Source: Ambulatory Visit | Attending: Family | Admitting: Family

## 2016-03-02 ENCOUNTER — Encounter: Payer: Self-pay | Admitting: *Deleted

## 2016-03-02 ENCOUNTER — Other Ambulatory Visit: Payer: Self-pay | Admitting: Family

## 2016-03-02 DIAGNOSIS — W19XXXA Unspecified fall, initial encounter: Secondary | ICD-10-CM

## 2016-03-10 ENCOUNTER — Ambulatory Visit
Admission: RE | Admit: 2016-03-10 | Discharge: 2016-03-10 | Disposition: A | Discharge status: Home / Self Care | Payer: BLUE CROSS/BLUE SHIELD | Source: Ambulatory Visit | Attending: Neurology | Admitting: Neurology

## 2016-03-10 DIAGNOSIS — M48062 Spinal stenosis, lumbar region with neurogenic claudication: Secondary | ICD-10-CM

## 2016-03-10 DIAGNOSIS — R531 Weakness: Secondary | ICD-10-CM

## 2016-03-10 DIAGNOSIS — H532 Diplopia: Secondary | ICD-10-CM

## 2016-03-10 DIAGNOSIS — G3184 Mild cognitive impairment, so stated: Secondary | ICD-10-CM

## 2016-03-10 DIAGNOSIS — R0600 Dyspnea, unspecified: Secondary | ICD-10-CM

## 2016-03-10 DIAGNOSIS — H02401 Unspecified ptosis of right eyelid: Secondary | ICD-10-CM

## 2016-03-10 MED ORDER — GADOBENATE DIMEGLUMINE 529 MG/ML IV SOLN
20.0000 mL | Freq: Once | INTRAVENOUS | Status: AC | PRN
  Administered 2016-03-10: 20 mL via INTRAVENOUS

## 2016-03-13 ENCOUNTER — Ambulatory Visit (INDEPENDENT_AMBULATORY_CARE_PROVIDER_SITE_OTHER): Payer: BLUE CROSS/BLUE SHIELD | Admitting: Psychology

## 2016-03-13 ENCOUNTER — Ambulatory Visit (INDEPENDENT_AMBULATORY_CARE_PROVIDER_SITE_OTHER): Payer: BLUE CROSS/BLUE SHIELD | Admitting: Neurology

## 2016-03-13 ENCOUNTER — Encounter: Payer: Self-pay | Admitting: Psychology

## 2016-03-13 DIAGNOSIS — H02401 Unspecified ptosis of right eyelid: Secondary | ICD-10-CM

## 2016-03-13 DIAGNOSIS — G5602 Carpal tunnel syndrome, left upper limb: Secondary | ICD-10-CM

## 2016-03-13 DIAGNOSIS — G3184 Mild cognitive impairment, so stated: Secondary | ICD-10-CM

## 2016-03-13 DIAGNOSIS — F1910 Other psychoactive substance abuse, uncomplicated: Secondary | ICD-10-CM

## 2016-03-13 DIAGNOSIS — R0600 Dyspnea, unspecified: Secondary | ICD-10-CM

## 2016-03-13 DIAGNOSIS — H532 Diplopia: Secondary | ICD-10-CM

## 2016-03-13 DIAGNOSIS — F1911 Other psychoactive substance abuse, in remission: Secondary | ICD-10-CM

## 2016-03-13 DIAGNOSIS — M48062 Spinal stenosis, lumbar region with neurogenic claudication: Secondary | ICD-10-CM

## 2016-03-13 DIAGNOSIS — R413 Other amnesia: Secondary | ICD-10-CM

## 2016-03-13 DIAGNOSIS — R531 Weakness: Secondary | ICD-10-CM

## 2016-03-13 DIAGNOSIS — G609 Hereditary and idiopathic neuropathy, unspecified: Secondary | ICD-10-CM

## 2016-03-13 DIAGNOSIS — F329 Major depressive disorder, single episode, unspecified: Secondary | ICD-10-CM

## 2016-03-13 DIAGNOSIS — F32A Depression, unspecified: Secondary | ICD-10-CM

## 2016-03-13 DIAGNOSIS — G709 Myoneural disorder, unspecified: Secondary | ICD-10-CM

## 2016-03-13 NOTE — Progress Notes (Signed)
NEUROPSYCHOLOGICAL INTERVIEW (CPT: [id number])  Name: Anthony Carlson Date of Birth: [birth year] Date of Interview: 03/13/2016  Reason for Referral:  Barnard Sharps is a 55 y.o., married male who is referred for neuropsychological evaluation by Dr. Nita Sickle of [facility] due to concerns about memory loss. This patient is unaccompanied in the office for today's appointment.  History of Presenting Problem:  Mr. Haymore saw Dr. Allena Katz for neurologic consultation on 02/24/2016 for memory loss and muscle weakness. MoCA was 30/30. He has a history of double vision, and there was evidence of right ptosis and generalized weakness on neuro exam. Dr. Allena Katz is doing a workup for myasthenia gravis. Brain MRI completed on 03/10/2016 revealed no acute infarction, hemorrhage, hydrocephalus, extra-axial collection or mass lesion. There was moderate atrophy and mild subcortical and periventricular T2 and FLAIR hyperintensities, likely chronic microvascular ischemic change. No specific abnormality was noted to correlate with the reported symptoms.  At today's interview, the patient reported gradual onset of memory difficulty approximately 2 to 2 1/2 years ago. He reported worsening over time with no significant fluctuation. 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 He sees most of his patients monthly and used to be able to recall their names and their spouse's names. Now he is having trouble remembering even the patients' names. He recognizes their faces but cannot recall their names. He is also having trouble recalling names of drugs that he prescribes frequently. He will have to look in [facility] by drug class in order to recall some of the names. He has not had any change in his clinical skill or ability, but these issues are meaning it takes longer to complete tasks.  He also notices memory difficulty outside of work. His wife is complaining that he does not do things she tells him to do.   Upon  direct questioning, the patient reported:   Forgetting recent conversations/events: Yes, wife would say that happens (conversations) Repeating statements/questions: No Misplacing/losing items: No Forgetting appointments or other obligations: No, write them down Forgetting to take medications: No  Difficulty concentrating: No Starting but not finishing tasks: Yes, somewhat (but not severe) Distracted easily: Yes Processing information more slowly: No  Word-finding difficulty: Yes, often Writing difficulty: No Spelling difficulty: No Comprehension difficulty: No  Getting lost when driving: No Making wrong turns when driving: No Uncertain about directions when driving or passenger: No  The patient continues to manage all instrumental ADLs and denied any problems with these.  Physically, the patient complains of significant muscle weakness over the past three years, and his mobility is significantly reduced. He reported having more falls recently. His most recent fall was three days ago. He was in the shower and closed his eyes to rinse shampoo out of his hair and fell right out of the bathtub. He denied any injuries. He did not hit his head. He has no history of LOC.  Mr. Palos does have a history of significant arthritis (ankles, knees, back) and spinal stenosis. He is 90% disabled through the [place] for arthritis.  With regard to current mood, the patient reported that his lack of mobility is depressing. He is unable to do many things he used to do, including camping with his 41 year old son. His wife has told him that he is getting angry more often, but he does not see this. She says he is yelling more often.  Mr. Wogan denied any problems with sleep or appetite. He wears a CPAP nightly for sleep apnea.  Psychiatric/Substance Abuse History: Mr. Hurley CiscoLand reported a history of substance abuse and dependence (alcohol and pain medications). He reported he has not had alcohol since 1987 and has  not abused any pain medications or other substances since 09/2006. Mr. Hurley CiscoLand also reported a history of depression with suicide attempt.  The patient reported that in April 2006, he had abused medication one night and was "hung over" the next day. He went through [employer] drive through and was apparently slurring his speech and "being ugly" to the employee taking his order. She refused to serve him, and he responded by showing her the gun he had in his holster at the drive through window. (He had a conceal and carry license at the time.) He was arrested and charged with assault and communicating a threat. His wife told him she was going to leave him. He took a "handful" of sample pills (butalbital) in an attempt to kill himself. His wife found him unresponsive and called EMS. He was taken to the hospital and held overnight but released the next day. He has not had any suicidal ideation, intention or further attempts since then.   In exchange for having the charges of assault and communicating a threat dropped, he had to complete three months of counseling, which he did, and which he found helpful. However, he did not believe at the time that he had a substance abuse problem so that was never a focus of treatment.   In 2008, Mr. 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 This was because he had not reported on his license renewal with the [place] [facility] [employer] that he had been charged with a crime in the past. (He states he took the question to mean convicted, not charged.) He lost his license to practice for 20 months and he was required to go to an inpatient rehabilitation program. He completed three months of inpatient rehabilitation in the summer of 2008. He found the counseling helpful in addressing his depression as well. For about a year after that, he took Wellbutrin [facility]. After a year, he decided to stop the medication to see how he was  doing, and he has not felt he needed it since then.   Mr. Hurley CiscoLand continues to attend AA meetings.  He denied any history of significant anxiety. He denied history of hallucinations (other than the one time he tried LSD as a teenager).   Family history is significant for alcoholism in his mother.   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  He has been a PA for 18 years. Marital history: The patient has been married three times. His first two marriages lasted five and 11 years, respectively, and ended in divorce. He has been married to his current wife for 15 years. The patient has a 56 year old daughter who lives in [place], and a 56 year old son who lives with him and his wife. Alcohol/Tobacco/Substances: No alcohol or substance abuse currently. Previous cigarette smoker; quit 1988 (smoked 2 1/2 ppd).    Medical History: Past Medical History:  Diagnosis Date  . Arthritis   . Chronic fatigue   . Depression   . GERD (gastroesophageal reflux disease)   . Hypertension   .  IBS (irritable bowel syndrome)   . Memory changes   . Mental disorder      Current Medications:  Outpatient Encounter Prescriptions as of 03/13/2016  Medication Sig  . Atorvastatin Calcium (LIPITOR PO) Take by mouth.  . DICLOFENAC PO Take by mouth.  . doxazosin (CARDURA) 8 MG tablet Take 8 mg by mouth daily.  . Eluxadoline (VIBERZI) 75 MG TABS Take 1 tablet by mouth 2 (two) times daily.  Marland Kitchen GABAPENTIN PO Take by mouth.  . Lisinopril-Hydrochlorothiazide (ZESTORETIC PO) Take by mouth.  . modafinil (PROVIGIL) 200 MG tablet Take 200 mg by mouth daily.  Marland Kitchen OMEPRAZOLE PO Take by mouth.  . testosterone cypionate (DEPO-TESTOSTERONE) 200 MG/ML injection Inject into the muscle  every 14 (fourteen) days.   No facility-administered encounter medications on file as of 03/13/2016.      Behavioral Observations:   Appearance: Neatly and appropriately dressed Gait: Ambulated with a cane, labored due to pain/weakness, mildly to moderately unsteady. Dysmetric due to leg length discrepancy. Speech: Fluent; normal rate, rhythm and volume. Mild word finding difficulty observed in conversational speech. Thought process: Linear, goal directed Affect: Full, relatively euthymic Interpersonal: Pleasant, appropriate, forthright   TESTING: There is medical necessity to proceed with neuropsychological assessment as the results will be used to aid in differential diagnosis and clinical decision-making and to inform specific treatment recommendations. Per the patient and medical records reviewed, there has been a change in cognitive functioning and a reasonable suspicion of a cognitive disorder.   PLAN: The patient will return for a full battery of neuropsychological testing with a psychometrician under my supervision. Education regarding testing procedures was provided. Subsequently, the patient will see this provider for a follow-up session at which time his test performances and my impressions and treatment recommendations will be reviewed in detail.   Full neuropsychological evaluation report to follow.

## 2016-03-13 NOTE — Procedures (Signed)
[facility]  [address] [place], [address]  [place], [place] [address] Tel: [phone] Fax:  [phone] Test Date:  03/13/2016  Patient: Anthony Carlson DOB: March 16, 1961 Physician: Nita Sickle, DO  Sex: Male Height: 5\' 9"  Ref Phys: Nita Sickle, DO  ID#: [id number] Temp: 35.7C Technician: Judie Petit. Dean   Patient Complaints: This is a 55 year old gentleman with history of right ankle fusion referred for evaluation of generalized weakness, right ptosis, and burning paresthesias of the left hand and feet.  NCV & EMG Findings: Extensive electrodiagnostic testing of the left upper and lower extremities and additional studies of the right lower extremity shows: 1. Left median sensory response is absent. Left ulnar sensory response is within normal limits. 2. Left median motor response shows prolonged distal onset latency (8.9 ms) and reduced amplitude (3.8 mV). Left ulnar motor responses within normal limits. 3. Bilateral superficial peroneal and right sural sensory responses are absent. 4. Bilateral peroneal motor responses show reduced amplitude. 5. Repetitive nerve stimulation of the peroneal nerve recording at the tibialis anterior shows evidence of decrement. These findings are present to a lesser degree in the right median and spinal accessory nerves, recording at the abductor pollicis and trapezius muscles, respectively. 6. In the left upper extremity, chronic motor axon loss changes were isolated to the left abductor pollicis brevis muscle. 7. In the lower extremities, chronic motor axon loss changes are seen affecting bilateral L5-S1 myotomes, without accompanied active denervation.  Impression: 1. 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 2. Distal and symmetric sensorimotor polyneuropathy, axon loss in type, affecting the lower extremities. 3. There is also evidence of an overlapping L5-S1 radiculopathy affecting bilateral lower extremities; moderate in  degree electrically. 4. Left median neuropathy, at or distal to the wrist, consistent with clinical diagnosis of carpal tunnel syndrome. Overall these findings are severe in degree electrically.   ___________________________ Nita Sickle, DO    Nerve Conduction Studies Anti Sensory Summary Table   Site NR Peak (ms) Norm Peak (ms) P-T Amp (V) Norm P-T Amp  Left Median Anti Sensory (2nd Digit)  35.7C  Wrist NR  <3.6  >15  Left Sup Peroneal Anti Sensory (Ant Lat Mall)  35.7C  12 cm NR  <4.6  >4  Right Sup Peroneal Anti Sensory (Ant Lat Mall)  35.7C  12 cm NR  <4.6  >4  Right Sural Anti Sensory (Lat Mall)  35.7C  Calf NR  <4.6  >4  Left Ulnar Anti Sensory (5th Digit)  Wrist    2.8 <3.1 13.6 >10    Motor Summary Table   Site NR Onset (ms) Norm Onset (ms) O-P Amp (mV) Norm O-P Amp Site1 Site2 Delta-0 (ms) Dist (cm) Vel (m/s) Norm Vel (m/s)  Left Median Motor (Abd Poll Brev)  35.7C  Wrist    8.9 <4.0 3.8 >6 Elbow Wrist 3.2 22.0 69 >50  Elbow    12.1  2.7  Axilla Elbow 8.3 0.0    Axilla    3.8  1.9         Right Peroneal Motor (Ext Dig Brev)  35.7C  Ankle    5.5 <6.0 0.5 >2.5 B Fib Ankle 8.3 33.0 40 >40  B Fib    13.8  0.5  Poplt B Fib 2.2 10.0 45 >40  Poplt    16.0  0.5         Left Peroneal TA Motor (Tib Ant)  35.7C  Fib Head    4.5 <4.5 1.5 >3 Poplit  Fib Head 1.1 10.0 91 >40  Poplit    5.6  1.5         Right Peroneal TA Motor (Tib Ant)  35.7C  Fib Head    3.2 <4.5 1.8 >3 Poplit Fib Head 1.2 10.0 83 >40  Poplit    4.4  1.6         Left Ulnar Motor (Abd Dig Minimi)  35.7C  Wrist    2.4 <3.1 11.1 >7 B Elbow Wrist 3.9 26.0 67 >50  B Elbow    6.3  10.9  A Elbow B Elbow 1.7 10.0 59 >50  A Elbow    8.0  10.8          EMG   Side Muscle Ins Act Fibs Psw Fasc Number Recrt Dur Dur. Amp Amp. Poly Poly. Comment  Right Gastroc Nml Nml Nml Nml 1- Rapid Many 1+ Many 1+ Nml Nml N/A  Right AntTibialis Nml Nml Nml Nml 2- Rapid Many 1+ Many 1+ Nml Nml N/A  Right RectFemoris Nml  Nml Nml Nml Nml Nml Nml Nml Nml Nml Nml Nml N/A  Right BicepsFemS Nml Nml Nml Nml 1- Rapid Many 1+ Many 1+ Nml Nml N/A  Right GluteusMed Nml Nml Nml Nml 1- Rapid Many 1+ Many 1+ Nml Nml N/A  Left 1stDorInt Nml Nml Nml Nml Nml Nml Nml Nml Nml Nml Nml Nml N/A  Left PronatorTeres Nml Nml Nml Nml Nml Nml Nml Nml Nml Nml Nml Nml N/A  Left Biceps Nml Nml Nml Nml Nml Nml Nml Nml Nml Nml Nml Nml N/A  Left Triceps Nml Nml Nml Nml Nml Nml Nml Nml Nml Nml Nml Nml N/A  Left Deltoid Nml Nml Nml Nml Nml Nml Nml Nml Nml Nml Nml Nml N/A  Left Abd Poll Brev Nml Nml Nml Nml 2- Rapid Some 1+ Some 1+ Nml Nml N/A  Left AntTibialis Nml Nml Nml Nml 2- Rapid Many 1+ Many 1+ Nml Nml N/A  Left Gastroc Nml Nml Nml Nml 1- Rapid Many 1+ Some 1+ Nml Nml N/A  Left BicepsFemS Nml Nml Nml Nml 1- Rapid Some 1+ Some 1+ Nml Nml N/A  Left GluteusMed Nml Nml Nml Nml 1- Rapid Some 1+ Some 1+ Nml Nml N/A   RNS   Trial # Label Amp 1 (mV)  O-P Amp 5 (mV)  O-P Amp % Dif Area 1 (mVms) Area 5 (mVms) Area % Dif Rep Rate Train Length Pause Time (min:sec) Comments  Right Trapezius  Tr 1 Baseline 4.47 4.36 -2.4 35.23 32.28 -8.4 3.00 10 00:30   Tr 2 Post Exercise 4.36 4.31 -1.2 34.97 30.95 -11.5 3.00 10 01:00   Tr 3 1 Min Post 4.42 4.38 -1.0 35.72 32.89 -7.9 3.00 10 01:00   Tr 4 2 Min Post 4.17 4.23 1.6 34.67 32.97 -4.9 3.00 10 01:00   Tr 5 3 Min Post 4.38 4.20 -4.0 35.92 32.30 -10.1 3.00 10 00:00   Right AntTibialis  Tr 1 Baseline 1.83 1.63 -10.8 5.48 4.70 -14.1 3.00 10 00:30   Tr 2 Post Exercise 1.99 1.70 -14.5 6.09 4.96 -18.5 3.00 10 01:00   Tr 3 1 Min Post 2.01 1.71 -14.7 6.18 4.94 -20.0 3.00 10 01:00   Tr 4 2 Min Post 1.98 1.72 -12.9 6.02 5.03 -16.4 3.00 10 01:00   Tr 5 3 Min Post 1.98 1.72 -13.3 6.11 5.10 -16.4 3.00 10 00:00   Right Abd Poll Brev - Run #2  Tr 1 Baseline 5.49 5.25 -4.4 18.33 17.14 -  6.5 3.00 10 00:30   Tr 2 Post Exercise 5.36 5.04 -6.0 17.23 15.32 -11.1 3.00 10 01:00   Tr 3 1 Min Post 4.82 4.34 -10.0 15.39  13.64 -11.4 3.00 10 01:00   Tr 4 2 Min Post 5.11 4.87 -4.7 15.83 14.53 -8.2 3.00 10 01:00   Tr 5 3 Min Post 5.15 4.91 -4.7 15.93 14.77 -7.3 3.00 10 00:00                    Waveforms:

## 2016-03-15 ENCOUNTER — Encounter: Payer: Self-pay | Admitting: Psychology

## 2016-03-16 ENCOUNTER — Encounter: Payer: Self-pay | Admitting: Neurology

## 2016-03-16 ENCOUNTER — Ambulatory Visit
Admission: RE | Admit: 2016-03-16 | Discharge: 2016-03-16 | Disposition: A | Discharge status: Home / Self Care | Payer: BLUE CROSS/BLUE SHIELD | Source: Ambulatory Visit | Attending: Neurology | Admitting: Neurology

## 2016-03-16 ENCOUNTER — Ambulatory Visit (INDEPENDENT_AMBULATORY_CARE_PROVIDER_SITE_OTHER): Payer: BLUE CROSS/BLUE SHIELD | Admitting: Neurology

## 2016-03-16 VITALS — BP 120/70 | HR 91 | Wt 322.3 lb

## 2016-03-16 DIAGNOSIS — M4806 Spinal stenosis, lumbar region: Secondary | ICD-10-CM | POA: Diagnosis not present

## 2016-03-16 DIAGNOSIS — R4789 Other speech disturbances: Secondary | ICD-10-CM

## 2016-03-16 DIAGNOSIS — G3184 Mild cognitive impairment, so stated: Secondary | ICD-10-CM | POA: Diagnosis not present

## 2016-03-16 DIAGNOSIS — H532 Diplopia: Secondary | ICD-10-CM

## 2016-03-16 DIAGNOSIS — M48062 Spinal stenosis, lumbar region with neurogenic claudication: Secondary | ICD-10-CM

## 2016-03-16 DIAGNOSIS — G709 Myoneural disorder, unspecified: Secondary | ICD-10-CM

## 2016-03-16 DIAGNOSIS — R0600 Dyspnea, unspecified: Secondary | ICD-10-CM

## 2016-03-16 MED ORDER — PYRIDOSTIGMINE BROMIDE 60 MG PO TABS
60.0000 mg | ORAL_TABLET | Freq: Three times a day (TID) | ORAL | 5 refills | Status: DC
Start: 2016-03-16 — End: 2017-10-02

## 2016-03-16 NOTE — Progress Notes (Signed)
Follow-up Visit   Date: 03/16/16    Anthony Carlson MRN: [id number] DOB: 02-01-1961   Interim History: Anthony Carlson is a 55 y.o. right-handed Caucasian male with depression, GERD, hypertension, chronic fatigue, OSA on CPAP, and IBS returning to the clinic for follow-up of generalized fatigue, double vision, and memory changes.  The patient was accompanied to the clinic by self.  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  He has noticed greater difficulty over the past few years and is unable to recall the names of medications or names of his patients.  He forgets details of conversation.  He is driving and does not get lost.  He has not noticed any difficulty managing complex decisions at work, managing finances, or medications.    Starting in mid-June, he began having double vision with images on top of each other.  He has noticed that this only occurs when he is focusing on his phone, computer, or reading and he changes his attention to something faraway.  Double vision is resolved if he closes one eye.  Symptoms are worse as the day progresses.  For the past 3 years, he complains of generalized weakness of the arms and legs.  He recalls that he was unable to join his son on a camping trip because his legs felt very weak.  Anytime that he exerts himself, he feels more fatigued. Rest does not help. He has been using a cane over the same time because of imbalance. He endorses chronic shortness of breath and sleeps on 1 pillow.   He has a right ankle fusion and ankle instability of the left foot.  He wears an ankle brace.    He is 90% disabled through the [place].   UPDATE 03/16/2016:  He presents today to discuss the results of his MRI, labs, and NCS/EMG.  His MRI brain shows moderate atrophy especially involving the frontal and temporal regions.  EDX showed decrement on RNS, consistent with NMJ disorder; distal neuropathy; L5-S1 radiculopathy, and  severe left CTS.  He has tried using a wrist splint, but this does not help.  He often has severe pain at the tip of his left middle finger which is only relieve with stretching his shoulder and arm.  Today he does not have ptosis and reports that I fluctuates and is worse at the day progresses.  He is tearful because he is unable to participate in physical activities with his son because of leg pain and shortness of breath.  Medications:  Current Outpatient Prescriptions on File Prior to Visit  Medication Sig Dispense Refill  . DICLOFENAC PO Take by mouth.    . doxazosin (CARDURA) 8 MG tablet Take 8 mg by mouth daily.    . Eluxadoline (VIBERZI) 75 MG TABS Take 1 tablet by mouth 2 (two) times daily.    Marland Kitchen GABAPENTIN PO Take by mouth.    . Lisinopril-Hydrochlorothiazide (ZESTORETIC PO) Take by mouth.    . modafinil (PROVIGIL) 200 MG tablet Take 200 mg by mouth daily.    Marland Kitchen OMEPRAZOLE PO Take by mouth.    . testosterone cypionate (DEPO-TESTOSTERONE) 200 MG/ML injection Inject into the muscle every 14 (fourteen) days.     No current facility-administered medications on file prior to visit.     Allergies:  Allergies  Allergen Reactions  . Avelox [Moxifloxacin Hcl In Nacl]     Review of Systems:  CONSTITUTIONAL: No fevers, chills, night sweats, or weight  loss.  EYES: +visual changes or eye pain ENT: No hearing changes.  No history of nose bleeds.   RESPIRATORY: No cough, wheezing +shortness of breath.   CARDIOVASCULAR: Negative for chest pain, and palpitations.   GI: Negative for abdominal discomfort, blood in stools or black stools.  No recent change in bowel habits.   GU:  No history of incontinence.   MUSCLOSKELETAL: +history of joint pain or swelling.  No myalgias.   SKIN: Negative for lesions, rash, and itching.   ENDOCRINE: Negative for cold or heat intolerance, polydipsia or goiter.   PSYCH:  + depression or anxiety symptoms.   NEURO: As Above.   Vital Signs:  BP 120/70    Pulse 91   Wt (!) 322 lb 5 oz (146.2 kg)   SpO2 92%   BMI 49.74 kg/m   Neurological Exam: MENTAL STATUS including orientation to time, place, person, recent and remote memory, attention span and concentration, language, and fund of knowledge is normal.  Speech is not dysarthric.  CRANIAL NERVES: No visual field defects.  Pupils equal round and reactive to light.  Normal conjugate, extra-ocular eye movements in all directions of gaze.  No ptosis.  Face is symmetric. Palate elevates symmetrically.  Tongue is midline.  MOTOR:  Motor strength is 5/5 in all extremities.  No pronator drift.  Tone is normal.    COORDINATION/GAIT:   Gait wide-based due to body habitus.   Data: MRI brain wwo contrast 03/10/2016: No acute infarction, hemorrhage, hydrocephalus, extra-axial collection or mass lesion. Moderate atrophy. Mild subcortical and periventricular T2 and FLAIR hyperintensities, likely chronic microvascular ischemic change.  On my review, there is disprorportionate atrophy of the frontal and temporal regions.   NCS/EMG 03/13/2016: 1. 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 2. Distal and symmetric sensorimotor polyneuropathy, axon loss in type, affecting the lower extremities. 3. There is also evidence of an overlapping L5-S1 radiculopathy affecting bilateral lower extremities; moderate in degree electrically. 4. Left median neuropathy, at or distal to the wrist, consistent with clinical diagnosis of carpal tunnel syndrome. Overall these findings are severe in degree electrically.   Labs 02/24/2016:  CRP 0.6, ESR 24, AChR antibodies (binding, blocking, modulating) neg  MRI lumbar spine wo contrast 03/24/2010: 1.  Lumbar spondylosis and degenerative disc disease, with impingement at L3-4, L4-5, and L5-S1. 2.  1.1 cm left retroperitoneal fluid signal intensity structure could represent venous varix, dilated lymphatic structure, or a mildly enlarged lymph  node.   IMPRESSION/PLAN: 1.  Seronegative myasthenia gravis  - NCS showed decrement in the peroneal nerve, less so in the spinal accessory and median nerves but with his ptosis, double vision, fatigue, and shortness of breath, NMJ disorder is highly considered.   - CT chest wo contrast to evaluate for thymoma  - Start mestinon 9m TID  - Check MUSK antibody   - Start low dose prednisone going forward if no improvement.  He is reluctant to start prednisone due to history of GI ulcers and swelling/weight gain  2.  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  Left carpal tunnel syndrome - severe in degree electrically  -  Start using a wrist splint   - Referral to [facility] was declined  5.  Mild cognitive impairment   - MRI brain shows generalized atrophy most notable in the frontal and temporal regions  - With this distribution of atrophy and his word findings difficulty, the possibility of early primary progressive aphasia is considered  Return to clinic in 2 months   The duration of this appointment visit was 40 minutes of face-to-face time with the patient.  Greater than 50% of this time was spent in counseling, explanation of diagnosis, planning of further management, and coordination of care.   Thank you for allowing me to participate in patient's care.  If I can answer any additional questions, I would be pleased to do so.    Sincerely,    Winnona Wargo K. Posey Pronto, DO

## 2016-03-16 NOTE — Patient Instructions (Addendum)
1.  Start mestinon 30mg  three times daily for one week, then increase 60mg  to three times daily 2.  CT chest wo contrast 3.  Send me an update in 2 weeks 4.  Return to clinic in 2-3 months

## 2016-03-19 ENCOUNTER — Encounter: Payer: Self-pay | Admitting: Neurology

## 2016-04-03 ENCOUNTER — Ambulatory Visit (INDEPENDENT_AMBULATORY_CARE_PROVIDER_SITE_OTHER): Payer: BLUE CROSS/BLUE SHIELD | Admitting: Psychology

## 2016-04-03 DIAGNOSIS — R413 Other amnesia: Secondary | ICD-10-CM

## 2016-04-03 DIAGNOSIS — G3184 Mild cognitive impairment, so stated: Secondary | ICD-10-CM

## 2016-04-03 NOTE — Progress Notes (Signed)
   Neuropsychology Note  Anthony Schoolhillip Eastman returned today for 3 hours of neuropsychological testing with technician, Wallace Kellerana Juliocesar Blasius, BS, under the supervision of Dr. Elvis CoilMaryBeth Bailar. The patient did not appear overtly distressed by the testing session, per behavioral observation or via self-report to the technician. Rest breaks were offered. Anthony Carlson will return within 2 weeks for a feedback session with Dr. Alinda DoomsBailar at which time his test performances, clinical impressions and treatment recommendations will be reviewed in detail. The patient understands he can contact our office should he require our assistance before this time.  Full report to follow.

## 2016-04-14 ENCOUNTER — Encounter (HOSPITAL_COMMUNITY): Payer: Self-pay

## 2016-04-14 ENCOUNTER — Emergency Department (HOSPITAL_COMMUNITY)
Admission: EM | Admit: 2016-04-14 | Discharge: 2016-04-14 | Disposition: A | Discharge status: Home / Self Care | Payer: BLUE CROSS/BLUE SHIELD | Attending: Emergency Medicine | Admitting: Emergency Medicine

## 2016-04-14 DIAGNOSIS — Z79899 Other long term (current) drug therapy: Secondary | ICD-10-CM | POA: Diagnosis not present

## 2016-04-14 DIAGNOSIS — R300 Dysuria: Secondary | ICD-10-CM | POA: Insufficient documentation

## 2016-04-14 DIAGNOSIS — R339 Retention of urine, unspecified: Secondary | ICD-10-CM | POA: Diagnosis present

## 2016-04-14 DIAGNOSIS — I10 Essential (primary) hypertension: Secondary | ICD-10-CM | POA: Insufficient documentation

## 2016-04-14 DIAGNOSIS — R39198 Other difficulties with micturition: Secondary | ICD-10-CM

## 2016-04-14 LAB — URINALYSIS, ROUTINE W REFLEX MICROSCOPIC
BILIRUBIN URINE: NEGATIVE
Glucose, UA: NEGATIVE mg/dL
Hgb urine dipstick: NEGATIVE
KETONES UR: NEGATIVE mg/dL
Leukocytes, UA: NEGATIVE
NITRITE: NEGATIVE
PROTEIN: NEGATIVE mg/dL
Specific Gravity, Urine: 1.017 (ref 1.005–1.030)
pH: 6.5 (ref 5.0–8.0)

## 2016-04-14 MED ORDER — SULFAMETHOXAZOLE-TRIMETHOPRIM 800-160 MG PO TABS: 1.0000 | ORAL_TABLET | Freq: Two times a day (BID) | ORAL | 0 refills | Status: AC

## 2016-04-14 NOTE — ED Provider Notes (Signed)
[facility]-EMERGENCY DEPT Provider Note   CSN: [id number] Arrival date & time: 04/14/16  [id number]     History   Chief Complaint Chief Complaint  Patient presents with  . Urinary Retention    HPI Anthony Carlson is a 55 y.o. male.  Patient with known history of spinal stenosis, myasthenia gravis, BPH on Flomax and doxazosin -- currently undergoing evaluation treatment by [facility] -- presents with complaint of urinary retention, dribbling, frequent urination for the past 5 days. He is taking his BPH medications. Patient was recently prescribed medication for myasthenia gravis, however has not started yet. Patient denies warning symptoms of back pain including: fecal incontinence, urinary retention or overflow incontinence, night sweats, waking from sleep with back pain, unexplained fevers or weight loss, h/o cancer, IVDU, recent trauma. He denies antihistamines or other medications which could cause urinary problems. No fevers, vomiting, diarrhea. Denies dysuria or urgency. The onset of this condition was acute. The course is constant. Aggravating factors: none. Alleviating factors: none.         Past Medical History:  Diagnosis Date  . Arthritis   . Chronic fatigue   . Depression   . GERD (gastroesophageal reflux disease)   . Hypertension   . IBS (irritable bowel syndrome)   . Memory changes   . Mental disorder     Patient Active Problem List   Diagnosis Date Noted  . Ptosis of eyelid 02/24/2016  . Spinal stenosis, lumbar region, with neurogenic claudication 02/24/2016    Past Surgical History:  Procedure Laterality Date  . ankle fusions    . vasectomy and reversal         Home Medications    Prior to Admission medications   Medication Sig Start Date End Date Taking? Authorizing Provider  atorvastatin (LIPITOR) 40 MG tablet Take 40 mg by mouth at bedtime.  03/13/16  Yes Historical Provider, MD  diclofenac (VOLTAREN) 75 MG EC tablet Take 75 mg by mouth 2 (two)  times daily.   Yes Historical Provider, MD  doxazosin (CARDURA) 8 MG tablet Take 8 mg by mouth daily.   Yes Historical Provider, MD  Eluxadoline (VIBERZI) 75 MG TABS Take 1 tablet by mouth 2 (two) times daily as needed (ibs).    Yes Historical Provider, MD  gabapentin (NEURONTIN) 400 MG capsule Take 400 mg by mouth 3 (three) times daily.   Yes Historical Provider, MD  lisinopril-hydrochlorothiazide (PRINZIDE,ZESTORETIC) 20-25 MG tablet Take 1 tablet by mouth daily.   Yes Historical Provider, MD  metoprolol (LOPRESSOR) 50 MG tablet Take 50 mg by mouth 2 (two) times daily.  03/08/16  Yes Historical Provider, MD  modafinil (PROVIGIL) 200 MG tablet Take 200 mg by mouth daily.   Yes Historical Provider, MD  omeprazole (PRILOSEC) 40 MG capsule Take 40 mg by mouth 2 (two) times daily.   Yes Historical Provider, MD  testosterone cypionate (DEPO-TESTOSTERONE) 200 MG/ML injection Inject into the muscle every 14 (fourteen) days.   Yes Historical Provider, MD  pyridostigmine (MESTINON) 60 MG tablet Take 1 tablet (60 mg total) by mouth 3 (three) times daily. Patient not taking: Reported on 04/14/2016 03/16/16   [place] Chardonika K Patel, DO    Family History Family History  Problem Relation Age of Onset  . Alcoholism Mother   . Drug abuse Sister   . Drug abuse Sister   . Severe combined immunodeficiency Maternal Uncle   . Suicidality Maternal Uncle     Social History Social History  Substance Use Topics  . Smoking status:  Never Smoker  . Smokeless tobacco: Never Used  . Alcohol use No     Allergies   Avelox [moxifloxacin hcl in nacl] and Pyridostigmine   Review of Systems Review of Systems  Constitutional: Negative for fever.  HENT: Negative for rhinorrhea and sore throat.   Eyes: Negative for redness.  Respiratory: Negative for cough.   Cardiovascular: Negative for chest pain.  Gastrointestinal: Negative for abdominal pain, diarrhea, nausea and vomiting.  Genitourinary: Positive for difficulty  urinating, enuresis and frequency. Negative for decreased urine volume, discharge, dysuria, flank pain, hematuria, penile pain, penile swelling, scrotal swelling, testicular pain and urgency.  Musculoskeletal: Negative for myalgias.  Skin: Negative for rash.  Neurological: Negative for headaches.     Physical Exam Updated Vital Signs BP (!) 142/104 (BP Location: Left Arm)   Pulse 77   Temp 97.8 F (36.6 C) (Oral)   Resp 20   SpO2 100%   Physical Exam  Constitutional: He appears well-developed and well-nourished.  HENT:  Head: Normocephalic and atraumatic.  Eyes: Conjunctivae are normal. Right eye exhibits no discharge. Left eye exhibits no discharge.  Neck: Normal range of motion. Neck supple.  Cardiovascular: Normal rate, regular rhythm and normal heart sounds.   Pulmonary/Chest: Effort normal and breath sounds normal.  Abdominal: Soft. He exhibits no distension. There is no tenderness. There is no rebound and no guarding.  Neurological: He is alert.  Skin: Skin is warm and dry.  Psychiatric: He has a normal mood and affect.  Nursing note and vitals reviewed.    ED Treatments / Results  Labs (all labs ordered are listed, but only abnormal results are displayed) Labs Reviewed  URINALYSIS, ROUTINE W REFLEX MICROSCOPIC (NOT AT [facility]) - Abnormal; Notable for the following:       Result Value   Color, Urine AMBER (*)    All other components within normal limits  URINE CULTURE    Procedures Procedures (including critical care time)    Initial Impression / Assessment and Plan / ED Course  I have reviewed the triage vital signs and the nursing notes.  Pertinent labs & imaging results that were available during my care of the patient were reviewed by me and considered in my medical decision making (see chart for details).  Clinical Course   Patient seen and examined. Reportedly small amount of urine in bladder on scan. UA pending.    Vital signs reviewed and are as  follows: BP (!) 142/104 (BP Location: Left Arm)   Pulse 77   Temp 97.8 F (36.6 C) (Oral)   Resp 20   SpO2 100%   9:28 AM patient updated on results. We discussed possible etiologies of his symptoms. Patient is concerned that he may be developing prostatitis and this may be leading to his irritative urinary symptoms. He requests Bactrim for empiric treatment. Will give 14 days, send urine culture, give urological follow-up.  Encouraged return to emergency department if fever, worsening symptoms, other concerns. Patient verbalizes understanding and agrees with plan.  Final Clinical Impressions(s) / ED Diagnoses   Final diagnoses:  Difficulty urinating   Patient with irritative bladder symptoms as well as BPH symptoms. Bladder scan today does not demonstrate urinary retention. Patient is able to empty his bladder but is having frequent small episodes of urination. No fever, chills. Patient does not appear toxic or septic. He is concerned about prostatitis. I feel that it is reasonable to treat empirically at this time. Patient has medical background and has been given return/follow-up  instructions as above.   New Prescriptions New Prescriptions   SULFAMETHOXAZOLE-TRIMETHOPRIM (BACTRIM DS,SEPTRA DS) 800-160 MG TABLET    Take 1 tablet by mouth 2 (two) times daily.     Renne Crigler, PA-C 04/14/16 0932    Nelva Nay, MD 04/15/16 [phone]

## 2016-04-14 NOTE — ED Notes (Signed)
Urinalysis collected and sent to lab at present time.

## 2016-04-14 NOTE — ED Triage Notes (Signed)
Patient c/o urinary retention x5 days.  Patient states that has been difficult to urinate since.  Patient states that the last time he felt as though he emptied his bladder was yesterday morning.  Patient states he has had "dribbling" this morning enough to take the pressure off the bladder. Denies blood in urine. Denies fevers, denies N/V.

## 2016-04-14 NOTE — Discharge Instructions (Signed)
Please read and follow all provided instructions.  Your diagnoses today include:  1. Difficulty urinating     Tests performed today include:  Urine test - normal, culture pending  Vital signs. See below for your results today.   Medications prescribed:   Bactrim (trimethoprim/sulfamethoxazole) - antibiotic  You have been prescribed an antibiotic medicine: take the entire course of medicine even if you are feeling better. Stopping early can cause the antibiotic not to work.  Home care instructions:  Follow any educational materials contained in this packet.  Follow-up instructions: Please follow-up with your primary care provider or the urologist in 7 days if symptoms are not resolved for further evaluation of your symptoms.  Return instructions:   Please return to the Emergency Department if you experience worsening symptoms.   Return with fever, worsening pain, persistent vomiting, worsening pain in your back.   Please return if you have any other emergent concerns.  Additional Information:  Your vital signs today were: BP 111/57 (BP Location: Left Arm)    Pulse 74    Temp 98.2 F (36.8 C) (Oral)    Resp 18    SpO2 94%  If your blood pressure (BP) was elevated above 135/85 this visit, please have this repeated by your doctor within one month. --------------

## 2016-04-15 LAB — URINE CULTURE: Culture: NO GROWTH

## 2016-04-22 NOTE — Progress Notes (Signed)
NEUROPSYCHOLOGICAL EVALUATION   Name:    Anthony Carlson  Date of Birth:   [birth year] Date of Interview:  03/13/2016 Date of Testing:  04/03/2016  Date of Feedback:  04/23/2016     Background Information:  Reason for Referral:  Anthony Carlson is a 55 y.o. male referred by Dr. Nita Sickle to assess his current level of cognitive functioning and assist in differential diagnosis. The current evaluation consisted of a review of available medical records, an interview with the patient, and the completion of a neuropsychological testing battery. Informed consent was obtained.  History of Presenting Problem:  Anthony Carlson saw Dr. Allena Katz for neurologic consultation on 02/24/2016 for memory loss and muscle weakness. MoCA was 30/30. He has a history of double vision, and there was evidence of right ptosis and generalized weakness on neuro exam. Subsequently, workup for myasthenia gravis was completed. Brain MRI completed on 03/10/2016 revealed no acute infarction, hemorrhage, hydrocephalus, extra-axial collection or mass lesion. There was moderate atrophy and mild subcortical and periventricular T2 and FLAIR hyperintensities, likely chronic microvascular ischemic change. On Dr. Eliane Decree review of the MRI, there was preferential atrophy of the frontal and temporal lobes. 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  At his neuropsychology clinical interview, the patient reported gradual onset of memory difficulty approximately 2 to 2 1/2 years ago. He reported worsening over time with no significant fluctuation. 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 He sees most of his patients monthly and used to be able to recall their names and their spouse's names. Now he is having trouble remembering even the patients' names. He recognizes their faces but cannot recall their names. He is also having trouble recalling names of drugs that he prescribes  frequently. He will have to look in [employer] by drug class in order to recall some of the names. He has not had any change in his clinical skill or ability, but these retrieval issues are meaning it takes longer to complete tasks.  He also notices memory difficulty outside of work. His wife is complaining that he does not do things she tells him to do.   Upon direct questioning, the patient reported:   Forgetting recent conversations/events: Yes, wife would say that happens (conversations) Repeating statements/questions: No Misplacing/losing items: No Forgetting appointments or other obligations: No, write them down Forgetting to take medications: No  Difficulty concentrating: No Starting but not finishing tasks: Yes, somewhat (but not severe) Distracted easily: Yes Processing information more slowly: No  Word-finding difficulty: Yes, often Writing difficulty: No Spelling difficulty: No Comprehension difficulty: No  Getting lost when driving: No Making wrong turns when driving: No Uncertain about directions when driving or passenger: No  The patient continues to manage all instrumental ADLs and denied any problems with these.  Physically, the patient complains of significant muscle weakness over the past three years, and his mobility is significantly reduced. He reported having more falls recently. His most recent fall, at time of clinical interview, was three days prior. He was in the shower and closed his eyes to rinse shampoo out of his hair and fell right out of the bathtub. He denied any injuries. He did not hit his head. He has no history of LOC.  Anthony Carlson does have a history of significant arthritis (ankles, knees, back), spinal stenosis and peripheral neuropathy. He is 90% disabled through the [place] for arthritis.  With regard to current mood, the patient reported that his  lack of mobility is depressing. He is unable to do many things he used to do, including camping  with his 56 year old son. His wife has told him that he is getting angry more often, but he does not see this. She says he is yelling more often.  Anthony Carlson denied any problems with sleep or appetite. He wears a CPAP nightly for sleep apnea.   Psychiatric/Substance Abuse History: Anthony Carlson reported a history of substance abuse and dependence (alcohol and pain medications). He reported he has not had alcohol since 1987 and has not abused any pain medications or other substances since 09/2006. Anthony Carlson also reported a history of depression with suicide attempt.  The patient reported that in April 2006, he had abused medication one night and was "hung over" the next day. He went through [employer] drive-through and was apparently slurring his speech and "being ugly" to the employee taking his order. She refused to serve him, and he responded by showing her the gun he had in his holster at the drive through window. (He had a conceal and carry license at the time.) He was arrested and charged with assault and communicating a threat. His wife told him she was going to leave him. He took a "handful" of butalbital tablets (he had several samples in his clinic) in an attempt to kill himself. His wife found him unresponsive and called EMS. He was taken to the hospital and held overnight but released the next day. He denied any suicidal ideation, intention or further attempts since then.   In exchange for having the charges of assault and communicating a threat dropped, he had to complete three months of counseling, which he did, and which he found helpful. However, he did not believe at that time that he had a substance abuse problem so that was never a focus of treatment.   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 This was because he had not reported on his license renewal with the [place] [employer] that he had been charged with a crime in the past.  (He states he took the question to mean convicted, not charged.) He lost his license to practice for 20 months and he was required to go to an inpatient rehabilitation program. He completed three months of inpatient rehabilitation in the summer of 2008. He found the counseling helpful in addressing his depression as well. For about a year after that, he took Wellbutrin [facility]. After a year, he decided to stop the medication to see how he was doing, and he has not felt he needed it since then.   Anthony Carlson continues to attend AA meetings.  He denied any history of significant anxiety. He denied history of hallucinations (other than the one time he tried LSD as a teenager).   Family history is significant for alcoholism in his mother.   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  He has been a PA for 18 years. Marital history: The patient has been married three times. His first two marriages lasted five and 11 years, respectively, and ended in divorce. He has been married to his current wife for 15 years. The patient  has a 81 year old daughter who lives in [place], and a 75 year old son who lives with him and his wife. Alcohol/Tobacco/Substances: No alcohol or substance use currently. Previous cigarette smoker; quit 1988 (smoked 2 1/2 ppd).    Medical History:  Past Medical History:  Diagnosis Date  . Arthritis   . Chronic fatigue   . Depression   . GERD (gastroesophageal reflux disease)   . Hypertension   . IBS (irritable bowel syndrome)   . Memory changes   . Mental disorder     Current medications:  Outpatient Encounter Prescriptions as of 04/23/2016  Medication Sig  . atorvastatin (LIPITOR) 40 MG tablet Take 40  mg by mouth at bedtime.   . diclofenac (VOLTAREN) 75 MG EC tablet Take 75 mg by mouth 2 (two) times daily.  Marland Kitchen doxazosin (CARDURA) 8 MG tablet Take 8 mg by mouth daily.  . Eluxadoline (VIBERZI) 75 MG TABS Take 1 tablet by mouth 2 (two) times daily as needed (ibs).   . gabapentin (NEURONTIN) 400 MG capsule Take 400 mg by mouth 3 (three) times daily.  Marland Kitchen lisinopril-hydrochlorothiazide (PRINZIDE,ZESTORETIC) 20-25 MG tablet Take 1 tablet by mouth daily.  . metoprolol (LOPRESSOR) 50 MG tablet Take 50 mg by mouth 2 (two) times daily.   . modafinil (PROVIGIL) 200 MG tablet Take 200 mg by mouth daily.  Marland Kitchen omeprazole (PRILOSEC) 40 MG capsule Take 40 mg by mouth 2 (two) times daily.  Marland Kitchen pyridostigmine (MESTINON) 60 MG tablet Take 1 tablet (60 mg total) by mouth 3 (three) times daily. (Patient not taking: Reported on 04/14/2016)  . sulfamethoxazole-trimethoprim (BACTRIM DS,SEPTRA DS) 800-160 MG tablet Take 1 tablet by mouth 2 (two) times daily.  Marland Kitchen testosterone cypionate (DEPO-TESTOSTERONE) 200 MG/ML injection Inject into the muscle every 14 (fourteen) days.   No facility-administered encounter medications on file as of 04/23/2016.      Current Examination:  Behavioral Observations:  Appearance: Neatly and appropriately dressed Gait: Ambulated with a cane, labored due to pain/weakness, mildly to moderately unsteady. Dysmetric due to leg length discrepancy. Speech: Fluent; normal rate, rhythm and volume. Mild word finding difficulty observed in conversational speech. Thought process: Linear, goal directed Affect: Full, relatively euthymic Interpersonal: Pleasant, appropriate, forthright Orientation: Oriented to all spheres. Accurately named the current President and his predecessor.  Tests Administered: . Test of Premorbid Functioning (TOPF) . Wechsler Adult Intelligence Scale-Fourth Edition (WAIS-IV): Similarities, Block Design, Matrix Reasoning, Arithmetic, Symbol Search, Coding and Digit Span  subtests . Wechsler Memory Scale-Fourth Edition (WMS-IV) Adult Version (ages 50-69): Logical Memory I, II and Recognition subtests  . [place] Verbal Learning Test - 2nd Edition (CVLT-2) Standard Form . Rey Complex Figure Test (RCFT) . Repeatable Battery for the Assessment of Neuropsychological Status (RBANS): Semantic Fluency subtest . [employer] Test [facility]) . Controlled Oral Word Association Test (COWAT) . Trail Making Test A and B . Neuropsychological Assessment Battery (NAB) Language Module, Form 1: Naming Subtest . Boston Diagnostic Aphasia Examination (BDAE): Complex Ideational Material, Repetition of Words, Repetition of Phrases Subtests . Clock Drawing Test . Reola Calkins Anxiety Inventory (BAI)  . Personality Assessment Inventory (PAI)  Test Results: Note: Standardized scores are presented only for use by appropriately trained professionals and to allow for any future test-retest comparison. These scores should not be interpreted without consideration of all the information that is contained in the rest of the report. The most recent standardization samples from the test publisher or other sources were used whenever possible to derive standard scores; scores were corrected for age, gender,  ethnicity and education when available.   Test Scores:  Test Name Standardized Score Descriptor  TOPF SS= 120 Superior  WAIS-IV Subtests    Similarities ss= 14 Superior  Block Design ss= 13 High average  Matrix Reasoning ss= 13 High average  Arithmetic ss= 13 High average  Symbol Search ss= 10 Average  Coding ss= 8 Average  Digit Span ss= 13 High average  WMS-IV Subtests    LM I ss= 13 High average  LM II ss= 14 Superior  LM II Recognition Cum %: >75 Above average  CVLT-II Scores    Trial 1 Z= 0 Average  Trial 5 Z= 0.5 Average  Trials 1-5 total T= 55 Average  SD Free Recall Z= 1 High average  SD Cued Recall Z= 0.5 Average  LD Free Recall Z= 1 High average  LD Cued Recall Z= 1  High average  Recognition Discriminability (14/16 hits, 0 false positives) Z= 1 High average  Forced Choice Recognition Raw= 16/16 WNL  RCFT    Copy Raw= 31/36; 6-10%ile   3' Recall T= 48 Average  30' Recall T= 51 Average  Recognition T= 53 Average  WCST    Total Errors T= 48 Average  Perseverative Responses T= 46 Average  Perseverative Errors T= 41 Low average  Conceptual Level Responses T= 48 Average  Categories Completed 3; >16% WNL  Trials to Complete 1st Category 12; > 16% WNL  Failure to Maintain Set 1   Trail Making Test A 2 errors T= 47 Average  Trail Making Test B 0 errors T= 59 High average  NAB Naming T= 52 Average  COWAT-FAS T= 48 Average  COWAT-Animals T= 35 Borderline  RBANS semantic fluency Z=-1.0 Low Average  BDAE Subtests    Repetition of Words Raw=10/10 WNL  Repetition of Phrases Raw= 10/10 WNL  Complex Ideational Material Raw= 12/12 WNL  Clock drawing  WNL  BAI 10/63 Mild  PAI Only elevated clinical scales are shown here:   SOM T= 80      Description of Test Results:  Performance validity indicators, including embedded effort measures, were within normal limits and therefore the results of neurocognitive testing are judged to be an accurate representation of his current cognitive functioning.  Premorbid verbal intellectual abilities were estimated to have been within the superior range based on a test of word reading. Psychomotor processing speed was average. Auditory attention and working memory were high average. Visual-spatial construction was high average. Language abilities were variable. Specifically, confrontation naming was intact (with 100% accuracy), while semantic verbal fluency was an area of relative weakness (i.e., borderline impaired for animals and low average for fruits/vegetables). Repetition of words and phrases was intact. Auditory comprehension of complex ideational material was intact. With regard to verbal memory, encoding and  acquisition of non-contextual information (i.e., word list) was average. After a brief distracter task, free recall was high average. He did not benefit additionally from semantic cueing. After a 20 minute delay, free recall was average. He did recall an additional word with semantic cueing; cued recall was high average. He demonstrated excellent semantic clustering of information across all recall trials, suggesting good cognitive efficiency and executive functioning. Performance on a yes/no recognition task was high average. On another verbal memory test, encoding and acquisition of contextual auditory information (i.e., short story) was high average. After a delay, free recall was superior. Performance on a yes/no recognition task was above average. With regard to non-verbal memory, delayed free recall of visual information was average.  Performance on a yes/no recognition task also was average. Executive functioning was intact overall. Mental flexibility and set-shifting were high average on Trails B. Verbal fluency with phonemic search restrictions was average. Verbal abstract reasoning was superior. Non-verbal abstract reasoning was high average. Deductive reasoning and problem solving skills were average. Performance on a clock drawing task was intact.   On a self-report questionnaire, the patient's overall score was suggestive of mild anxiety; however, review of responses indicated that symptoms endorsed are due to neurologic symptoms (e.g., numbness and tingling, unsteadiness) rather than psychological distress/anxiety. The patient was also administered a more extensive measure of psychopathology and personality functioning (PAI). Upon review of symptom validity indicators on the PAI, there is no evidence to suggest that the patient was motivated to portray himself in a more negative or pathological light than the clinical picture would warrant. However, with respect to positive impression management, the  client's pattern of responses suggests that he tends to portray himself as being relatively free of common shortcomings to which most individuals will admit, and he appears somewhat reluctant to recognize minor faults in himself.  Given this apparent tendency to repress undesirable characteristics, the interpretive hypotheses derived from the PAI should be reviewed with caution.  Although there is no evidence to suggest an effort to intentionally distort the profile, the results may underrepresent the extent and degree of any significant findings in certain areas due to the client's tendency to avoid negative or unpleasant aspects of himself. On the PAI, there was only one elevated clinical scale (Somatic complaints). An elevation on this scale indicates that the patient has a significant degree of concern about physical functioning and health matters and probable impairment arising from somatic symptoms. This is not surprising, given his current neurologic complaints, new diagnosis of neuromuscular junction disorder and associated functional decline. The patient also admitted to prior difficulties with drug and alcohol use, consistent with self-report during the clinical interview. According to the patient's self-report, he describes NO significant problems in the following areas: unusual thoughts or peculiar experiences; problems with empathy; undue suspiciousness or hostility; extreme moodiness and impulsivity; unhappiness and depression; unusually elevated mood or heightened activity; marked anxiety; problematic behaviors used to manage anxiety.  With respect to suicidal ideation, the patient is NOT reporting distress from thoughts of self-harm.   Clinical Impressions: Adjustment disorder with depressed mood. Cognitive diagnosis deferred at this time.  Results of cognitive testing were largely within normal limits and commensurate with his estimated premorbid intellectual abilities in the high average to  superior range. He did demonstrate a relative weakness on tests of semantic verbal fluency. However, all other aspects of language tested were intact (including confrontation naming, auditory comprehension and repetition of words/phrases). At this point in time, his test results do not warrant a diagnosis of a cognitive disorder, but I cannot rule out prodromal primary progressive aphasia, especially given the patient's primary subjective complaint of word finding difficulty and recent neuroimaging showing preferential fronto-temporal atrophy. However, I feel that the patient's current stress level is a key factor affecting his current level of day-to-day functioning, and I am not convinced that there is any superimposed neurocognitive disorder developing. Fortunately, I do not see evidence of any underlying Alzheimer's disease at the present time, which is what the patient was concerned about upon presentation for this evaluation. While he is not reporting symptoms consistent with a major depressive episode, he is experiencing depressive symptoms as he adjusts to his declining physical functioning and inability to  participate in physical activities he finds meaningful.    Recommendations/Plan: Based on the findings of the present evaluation, the following recommendations are offered:  1. I would like to maintain close follow-up of this patient since there is a possibility of developing PPA. I explained this to the patient and he is amenable. I will see him back in six months and determine the need for brief re-evaluation at that time.  2. The patient should continue to use compensatory strategies, e.g., retrieval aids. At the present time, it appears that such aids do provide full compensation of deficits, but it will require more time to complete tasks, and as such I've recommended he consider whether he needs to lower productivity standards at work. He feels he can currently manage his case load,  especially with the assistance of his nurse who is very helpful to him. 3. I think the patient could benefit from counseling to assist his adjustment to medical disorders and decline in physical functioning. He was amenable to this, and I will place [facility]. Treatment with an antidepressant may need to be considered again in the future.    Feedback to Patient: Anthony Carlson and his wife returned for a feedback appointment on 04/23/2016 to review the results of his neuropsychological evaluation with this provider. 30 minutes face-to-face time was spent reviewing his test results, my impressions and my recommendations as detailed above.    Total time spent on this patient's case: 90791x1 unit for interview with psychologist; [phone] units of testing by psychometrician under psychologist's supervision; [phone] units for medical record review, scoring of neuropsychological tests, interpretation of test results, preparation of this report, and review of results to the patient by psychologist.      [facility]. Please feel free to contact me if you have any questions or concerns regarding this report.

## 2016-04-23 ENCOUNTER — Ambulatory Visit (INDEPENDENT_AMBULATORY_CARE_PROVIDER_SITE_OTHER): Payer: BLUE CROSS/BLUE SHIELD | Admitting: Psychology

## 2016-04-23 DIAGNOSIS — F4321 Adjustment disorder with depressed mood: Secondary | ICD-10-CM

## 2016-04-23 DIAGNOSIS — R4789 Other speech disturbances: Secondary | ICD-10-CM

## 2016-04-23 DIAGNOSIS — R413 Other amnesia: Secondary | ICD-10-CM

## 2016-04-24 ENCOUNTER — Encounter: Payer: Self-pay | Admitting: Psychology

## 2016-04-24 NOTE — Addendum Note (Signed)
Addended by: Othelia PullingBAILAR-HEATH, MARY B on: 04/24/2016 09:47 AM   Modules accepted: Orders

## 2016-05-11 ENCOUNTER — Ambulatory Visit: Payer: BLUE CROSS/BLUE SHIELD | Admitting: Psychology

## 2016-05-13 ENCOUNTER — Encounter: Payer: Self-pay | Admitting: Psychology

## 2016-05-25 ENCOUNTER — Ambulatory Visit: Payer: BLUE CROSS/BLUE SHIELD | Admitting: Neurology

## 2016-05-31 ENCOUNTER — Encounter: Payer: Self-pay | Admitting: Psychology

## 2016-06-01 ENCOUNTER — Ambulatory Visit: Payer: BLUE CROSS/BLUE SHIELD | Admitting: Psychology

## 2016-06-05 ENCOUNTER — Encounter: Payer: Self-pay | Admitting: Neurology

## 2016-06-29 ENCOUNTER — Encounter: Payer: Self-pay | Admitting: Neurology

## 2016-07-06 ENCOUNTER — Ambulatory Visit (INDEPENDENT_AMBULATORY_CARE_PROVIDER_SITE_OTHER): Payer: BLUE CROSS/BLUE SHIELD | Admitting: Neurology

## 2016-07-06 ENCOUNTER — Encounter: Payer: Self-pay | Admitting: *Deleted

## 2016-07-06 ENCOUNTER — Encounter: Payer: Self-pay | Admitting: Neurology

## 2016-07-06 VITALS — BP 128/80 | HR 73 | Ht 67.5 in | Wt 322.2 lb

## 2016-07-06 DIAGNOSIS — M4802 Spinal stenosis, cervical region: Secondary | ICD-10-CM

## 2016-07-06 DIAGNOSIS — G5602 Carpal tunnel syndrome, left upper limb: Secondary | ICD-10-CM

## 2016-07-06 DIAGNOSIS — F4321 Adjustment disorder with depressed mood: Secondary | ICD-10-CM | POA: Diagnosis not present

## 2016-07-06 DIAGNOSIS — G709 Myoneural disorder, unspecified: Secondary | ICD-10-CM | POA: Diagnosis not present

## 2016-07-06 DIAGNOSIS — M48062 Spinal stenosis, lumbar region with neurogenic claudication: Secondary | ICD-10-CM | POA: Diagnosis not present

## 2016-07-06 NOTE — Progress Notes (Signed)
Follow-up Visit   Date: 07/06/16    Anthony Carlson MRN: [id number] DOB: 07-05-1960   Interim History: Anthony Carlson is a 56 y.o. right-handed Caucasian male with depression, GERD, hypertension, chronic fatigue, OSA on CPAP, and IBS returning to the clinic for follow-up of generalized fatigue, double vision, and memory changes.  The patient was accompanied to the clinic by self.  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  He has noticed greater difficulty over the past few years and is unable to recall the names of medications or names of his patients.  He forgets details of conversation.  He is driving and does not get lost.  He has not noticed any difficulty managing complex decisions at work, managing finances, or medications.    Starting in mid-June, he began having double vision with images on top of each other.  He has noticed that this only occurs when he is focusing on his phone, computer, or reading and he changes his attention to something faraway.  Double vision is resolved if he closes one eye.  Symptoms are worse as the day progresses.  For the past 3 years, he complains of generalized weakness of the arms and legs.  He recalls that he was unable to join his son on a camping trip because his legs felt very weak.  Anytime that he exerts himself, he feels more fatigued. Rest does not help. He has been using a cane over the same time because of imbalance. He endorses chronic shortness of breath and sleeps on 1 pillow.   He has a right ankle fusion and ankle instability of the left foot.  He wears an ankle brace.    He is 90% disabled through the [place].   UPDATE 03/16/2016:  He presents today to discuss the results of his MRI, labs, and NCS/EMG.  His MRI brain shows moderate atrophy especially involving the frontal and temporal regions.  EDX showed decrement on RNS, consistent with NMJ disorder; distal neuropathy; L5-S1 radiculopathy, and  severe left CTS.  He has tried using a wrist splint, but this does not help.  He often has severe pain at the tip of his left middle finger which is only relieve with stretching his shoulder and arm.  Today he does not have ptosis and reports that I fluctuates and is worse at the day progresses.  He is tearful because he is unable to participate in physical activities with his son because of leg pain and shortness of breath.  UPDATE 07/06/2016:  He is here for a 4 month follow-up visit. At this last visit, I offered to start a trial of pyridostigmine to see if this would help with his ptosis, double vision, and weakness. Unfortunately, he did not tolerate mestinon due to diarrhea so we are unable to determine whether it provided any effect.  Although there is no change in his double vision, his generalized weakness is getting worse. He further states that he is being evaluated for motorized chair through the [place].  He has questions regarding his neuropsychological assessment and is seeking a second opinion for this.  He is also asking whether all of his symptoms could be due to post [place] war syndrome due to a number of medications they were given.  He also brings an MRI report from January 2018 which shows central disc protrusion and disc osteophyte complex at C5-C6. He has known chronic neck pain and was concerned whether his  hand paresthesias was coming from carpal tunnel syndrome or his neck.  Medications:  Current Outpatient Prescriptions on File Prior to Visit  Medication Sig Dispense Refill  . atorvastatin (LIPITOR) 40 MG tablet Take 40 mg by mouth at bedtime.   2  . diclofenac (VOLTAREN) 75 MG EC tablet Take 75 mg by mouth 2 (two) times daily.    Marland Kitchen doxazosin (CARDURA) 8 MG tablet Take 8 mg by mouth daily.    . Eluxadoline (VIBERZI) 75 MG TABS Take 1 tablet by mouth 2 (two) times daily as needed (ibs).     . gabapentin (NEURONTIN) 400 MG capsule Take 400 mg by mouth 3 (three) times daily.    Marland Kitchen  lisinopril-hydrochlorothiazide (PRINZIDE,ZESTORETIC) 20-25 MG tablet Take 1 tablet by mouth daily.    . metoprolol (LOPRESSOR) 50 MG tablet Take 50 mg by mouth 2 (two) times daily.   1  . modafinil (PROVIGIL) 200 MG tablet Take 200 mg by mouth daily.    Marland Kitchen omeprazole (PRILOSEC) 40 MG capsule Take 40 mg by mouth 2 (two) times daily.    Marland Kitchen pyridostigmine (MESTINON) 60 MG tablet Take 1 tablet (60 mg total) by mouth 3 (three) times daily. (Patient not taking: Reported on 04/14/2016) 90 tablet 5  . testosterone cypionate (DEPO-TESTOSTERONE) 200 MG/ML injection Inject into the muscle every 14 (fourteen) days.     No current facility-administered medications on file prior to visit.     Allergies:  Allergies  Allergen Reactions  . Avelox [Moxifloxacin Hcl In Nacl] Anaphylaxis  . Pyridostigmine Diarrhea and Nausea Only    Review of Systems:  CONSTITUTIONAL: No fevers, chills, night sweats, or weight loss.  EYES: +visual changes or eye pain ENT: No hearing changes.  No history of nose bleeds.   RESPIRATORY: No cough, wheezing +shortness of breath.   CARDIOVASCULAR: Negative for chest pain, and palpitations.   GI: Negative for abdominal discomfort, blood in stools or black stools.  No recent change in bowel habits.   GU:  No history of incontinence.   MUSCLOSKELETAL: +history of joint pain or swelling.  No myalgias.   SKIN: Negative for lesions, rash, and itching.   ENDOCRINE: Negative for cold or heat intolerance, polydipsia or goiter.   PSYCH:  + depression or anxiety symptoms.   NEURO: As Above.   Vital Signs:  BP 128/80   Pulse 73   Ht 5' 7.5" (1.715 m)   Wt (!) 322 lb 3 oz (146.1 kg)   SpO2 96%   BMI 49.72 kg/m   Neurological Exam: MENTAL STATUS including orientation to time, place, person, recent and remote memory, attention span and concentration, language, and fund of knowledge is normal.  Speech is not dysarthric.  CRANIAL NERVES:  Pupils equal round and reactive to light.   Normal conjugate, extra-ocular eye movements in all directions of gaze.  No ptosis.  Face is symmetric. Palate elevates symmetrically.  Tongue is midline.  Motor strength testing of the orbicularis oris, buccinator and tongue strength is 5/5.  MOTOR:  Motor strength is 5/5 in all extremities, including neck flexion.    COORDINATION/GAIT:   Gait wide-based due to body habitus, assisted with cane.   Data: MRI brain wwo contrast 03/10/2016: No acute infarction, hemorrhage, hydrocephalus, extra-axial collection or mass lesion. Moderate atrophy. Mild subcortical and periventricular T2 and FLAIR hyperintensities, likely chronic microvascular ischemic change.  On my review, there is disprorportionate atrophy of the frontal and temporal regions.   NCS/EMG 03/13/2016: 1. 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 2. Distal and symmetric sensorimotor polyneuropathy, axon loss in type, affecting the lower extremities. 3. There is also evidence of an overlapping L5-S1 radiculopathy affecting bilateral lower extremities; moderate in degree electrically. 4. Left median neuropathy, at or distal to the wrist, consistent with clinical diagnosis of carpal tunnel syndrome. Overall these findings are severe in degree electrically.   Labs 02/24/2016:  CRP 0.6, ESR 24, AChR antibodies (binding, blocking, modulating) neg  MRI lumbar spine wo contrast 03/24/2010: 1.  Lumbar spondylosis and degenerative disc disease, with impingement at L3-4, L4-5, and L5-S1. 2.  1.1 cm left retroperitoneal fluid signal intensity structure could represent venous varix, dilated lymphatic structure, or a mildly enlarged lymph node.  MRI lumbar spine wo contrast 10/31/2015:   1.Degenerative disc disease and facet arthrosis as described above with a minimal anterolisthesis at L3-L4. This is most notable for a small left foraminal disc protrusion at L2-L3 abutting the exiting left L2 nerve root. 2.Moderate spinal  canal and lateral recess stenosis and mild bilateral neural foraminal compromise at L3-L4. 3.Moderate right lateral recess stenosis and possible impingement of the traversing right S1 nerve root at L5-S1.  CT chest wo contrast 03/16/2016: 1. Suboptimal study without IV contrast. No definite mediastinal mass or adenopathy. 2. Atherosclerotic calcifications of coronary arteries. 3. No acute infiltrate or pulmonary edema. 4. Degenerative changes thoracolumbar spine.  MRI cervical spine 07/02/2016: 1. Central disc protrusion disc osteophyte complex at C5-6 contacts and indents the ventral cervical cord. No underlying cord signal abnormality is identified. 2. Multilevel cervical spondylosis with varying levels of mild bilateral foraminal stenosis. 3. No acute fracture. Mild levoconvex curvature of the lower spine.  IMPRESSION/PLAN: 1.  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 At his last visit, a trial of Mestinon was started, but this was stopped due to GI upset. We discussed starting low-dose prednisone, however, he is reluctant to start prednisone due to  concern of diabetes, swelling, weight gain.  Alternative options of azathioprine and CellCept were discussed, however these would take at least 6 months to see their benefit.  I do not feel that this is consistent with post [place] war syndrome as there is evidence of an explanation for his weakness from his EDX. He will be refer him to a tertiary care center for a second opinion.   2.  Left carpal tunnel syndrome - severe in degree electrically on EDX Explained that his hand symptoms may be due to overlapping cervical canal stenosis and carpal tunnel syndrome. Because of the severity of CTS on electrodiagnostic testing, surgery is certainly an option. If he is  uncertain about this, a steroid injection can also be tried.   3.  Cognitive impairment, adjustment disorder with depressed mood MRI brain shows generalized atrophy most notable in the frontal and temporal regions Neuropsychological testing from October 2017 was most consistent with adjustment disorder with depressed mood. The testing did not include cognitive disorder, however with his word finding difficulty and MRI findings, he would need clinical follow-up to look for signs of primary progressive aphasia.  His wife, Electrical engineer, is very worried about the possibility of him having primary progressive aphasia. I reassured patient that this was diagnosed to keep in mind going forward, however there was not enough clinical evidence to make this diagnosis definitively. He is seeking a second opinion  for his neuropsychological evaluation  4. 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  Cervical canal stenosis at C5-6 due to osteophyte complex, [facility]    The duration of this appointment visit was 40 minutes of face-to-face time with the patient.  Greater than 50% of this time was spent in counseling, explanation of diagnosis, planning of further management, and coordination of care.   Thank you for allowing me to participate in patient's care.  If I can answer any additional questions, I would be pleased to do so.    Sincerely,    Jadrien Narine K. Posey Pronto, DO

## 2016-11-02 HISTORY — PX: THULIUM LASER TURP (TRANSURETHRAL RESECTION OF PROSTATE): SHX6744

## 2017-04-10 ENCOUNTER — Other Ambulatory Visit: Payer: Self-pay | Admitting: Surgical Oncology

## 2017-04-10 DIAGNOSIS — K449 Diaphragmatic hernia without obstruction or gangrene: Secondary | ICD-10-CM

## 2017-10-01 ENCOUNTER — Encounter: Payer: Self-pay | Admitting: Registered"

## 2017-10-01 ENCOUNTER — Encounter: Payer: BLUE CROSS/BLUE SHIELD | Attending: General Surgery | Admitting: Registered"

## 2017-10-01 DIAGNOSIS — Z713 Dietary counseling and surveillance: Secondary | ICD-10-CM | POA: Diagnosis not present

## 2017-10-01 DIAGNOSIS — Z6841 Body Mass Index (BMI) 40.0 and over, adult: Secondary | ICD-10-CM | POA: Insufficient documentation

## 2017-10-01 DIAGNOSIS — E669 Obesity, unspecified: Secondary | ICD-10-CM

## 2017-10-01 NOTE — Progress Notes (Signed)
Pre-Op Assessment Visit:  Pre-Operative Sleeve Gastrectomy Surgery  Medical Nutrition Therapy:  Appt start time: 4:35  End time:  5:45  Patient was seen on 10/01/2017 for Pre-Operative Nutrition Assessment. Assessment and letter of approval faxed to [facility] Surgery Bariatric Surgery Program coordinator on 10/01/2017.   Pt expectation of surgery: increase mobility, no longer dependent on wheelchair, improve health, reduce bp medications, prevent diabetes  Pt expectation of Dietitian: education on what   Start weight at [facility]: 288.2 BMI: 45.82   Pt states he quit drinking alcohol 32 years ago, quit smoking 31 years ago. Pt states he knows he gets addicted to things fast. Pt states he and wife see therapist regularly. Pt states he is 100% disabled according to [place], but works as Advice worker. Pt sates he has muscle weakness in thighs and shoulders at times; limited physical activity capabilities. Pt states his step mother told him he was too fat during his childhood.  Pt states he will probably not omit caffeine throughout this surgery process. RD educated and counseled pt that stomach and digestive system is extra-sensitive post-bariatric surgery and its a good idea to not consume caffeine for the first 2 months post-op.   Per insurance, pt needs 0 SWL visits prior to surgery. Pt has surgery date scheduled for 4/22. Pt states he started with [facility] program and became frustrated with scheduling process; it was taking too long to schedule surgery date therefore he transferred to our program.    24 hr Dietary Recall: First Meal: protein smoothie (fruit, fresh green vegetables, protein powder, coffee) Snack: sometimes KIND protein bar or beef stick Second Meal: [employer] - chicken bites or grilled chicken caesar salad Snack: none Third Meal: [employer]-[place]  Snack: none Beverages: 2 cups coffee, water with caffeinated crystal light  Encouraged to engage in 75  minutes of moderate physical activity including cardiovascular and weight baring weekly  Handouts given during visit include:  . Pre-Op Goals . Bariatric Surgery Protein Shakes . Vitamin and Mineral Recommendations  During the appointment today the following Pre-Op Goals were reviewed with the patient: . Track your food and beverage: MyFitness Pal or Baritastic App . Make healthy food choices . Begin to limit portion sizes . Limited concentrated sugars and fried foods . Keep fat/sugar in the single digits per serving on         food labels . Practice CHEWING your food  (aim for 30 chews per bite or until applesauce consistency) . Practice not drinking 15 minutes before, during, and 30 minutes after each meal/snack . Avoid all carbonated beverages  . Avoid/limit caffeinated beverages  . Avoid all sugar-sweetened beverages . Avoid alcohol . Consume 3 meals per day; eat every 3-5 hours . Make a list of non-food related activities . Aim for 64-100 ounces of FLUID daily  . Aim for at least 60-80 grams of PROTEIN daily . Look for a liquid protein source that contain ?15 g protein and ?5 g carbohydrate  (ex: shakes, drinks, shots) . Physical activity is an important part of a healthy lifestyle so keep it moving!  Follow diet recommendations listed below Energy and Macronutrient Recommendations: Calories: 1600 Carbohydrate: 180 Protein: 135 Fat: 50  Demonstrated degree of understanding via:  Teach Back   Teaching Method Utilized:  Visual Auditory Hands on  Barriers to learning/adherence to lifestyle change: none identified  Patient to call the [facility] Education [facility] to enroll in Pre-Op and Post-Op Nutrition Education when surgery date is  scheduled.

## 2017-10-03 ENCOUNTER — Ambulatory Visit: Payer: Self-pay | Admitting: General Surgery

## 2017-10-07 ENCOUNTER — Encounter: Payer: Self-pay | Admitting: Skilled Nursing Facility1

## 2017-10-07 ENCOUNTER — Encounter: Payer: BLUE CROSS/BLUE SHIELD | Admitting: Skilled Nursing Facility1

## 2017-10-07 DIAGNOSIS — E669 Obesity, unspecified: Secondary | ICD-10-CM

## 2017-10-07 DIAGNOSIS — Z713 Dietary counseling and surveillance: Secondary | ICD-10-CM | POA: Diagnosis not present

## 2017-10-07 NOTE — Patient Instructions (Addendum)
Anthony Carlson  10/07/2017   Your procedure is scheduled on: 10-14-17   Report to [facility]  Entrance Report to Admitting at 9:15 AM    Call this number if you have problems the morning of surgery [phone]       Take these medicines the morning of surgery with A SIP OF WATER: Doxazosin (Cardura), Gabapentin (Neurontin), Metoprolol, Omeprazole (Prilosec),and Uloric                                You may not have any metal on your body including hair pins and              piercings  Do not wear jewelry, lotions, powders, deodorant             Men may shave face and neck.   Do not bring valuables to the hospital. [facility] IS NOT             RESPONSIBLE   FOR VALUABLES.  Contacts, dentures or bridgework may not be worn into surgery.  Leave suitcase in the car. After surgery it may be brought to your room.   Please bring your mask and tubing for your CPAP Machine               Please read over the following fact sheets you were given: _____________________________________________________________________          [facility] - Preparing for Surgery Before surgery, you can play an important role.  Because skin is not sterile, your skin needs to be as free of germs as possible.  You can reduce the number of germs on your skin by washing with CHG (chlorahexidine gluconate) soap before surgery.  CHG is an antiseptic cleaner which kills germs and bonds with the skin to continue killing germs even after washing. Please DO NOT use if you have an allergy to CHG or antibacterial soaps.  If your skin becomes reddened/irritated stop using the CHG and inform your nurse when you arrive at Short Stay. Do not shave (including legs and underarms) for at least 48 hours prior to the first CHG shower.  You may shave your face/neck. Please follow these instructions carefully:  1.  Shower with CHG Soap the night before surgery and the  morning of Surgery.  2.  If you  choose to wash your hair, wash your hair first as usual with your  normal  shampoo.  3.  After you shampoo, rinse your hair and body thoroughly to remove the  shampoo.                           4.  Use CHG as you would any other liquid soap.  You can apply chg directly  to the skin and wash                       Gently with a scrungie or clean washcloth.  5.  Apply the CHG Soap to your body ONLY FROM THE NECK DOWN.   Do not use on face/ open                           Wound or open sores. Avoid contact with eyes, ears mouth and  genitals (private parts).                       Wash face,  Genitals (private parts) with your normal soap.             6.  Wash thoroughly, paying special attention to the area where your surgery  will be performed.  7.  Thoroughly rinse your body with warm water from the neck down.  8.  DO NOT shower/wash with your normal soap after using and rinsing off  the CHG Soap.                9.  Pat yourself dry with a clean towel.            10.  Wear clean pajamas.            11.  Place clean sheets on your bed the night of your first shower and do not  sleep with pets. Day of Surgery : Do not apply any lotions/deodorants the morning of surgery.  Please wear clean clothes to the hospital/surgery center.  FAILURE TO FOLLOW THESE INSTRUCTIONS MAY RESULT IN THE CANCELLATION OF YOUR SURGERY PATIENT SIGNATURE_________________________________  NURSE SIGNATURE__________________________________  ________________________________________________________________________ MORNING OF SURGERY DRINK:  1SHAKE BEFORE YOU LEAVE HOME, DRINK ALL OF THE SHAKE AT ONE TIME.   NO SOLID FOOD AFTER 600 PM THE NIGHT BEFORE YOUR SURGERY. YOU MAY DRINK CLEAR FLUIDS. THE SHAKE YOU DRINK BEFORE YOU LEAVE HOME WILL BE THE LAST FLUIDS YOU DRINK BEFORE SURGERY.  PAIN IS EXPECTED AFTER SURGERY AND WILL NOT BE COMPLETELY ELIMINATED. AMBULATION AND TYLENOL WILL HELP REDUCE INCISIONAL AND GAS PAIN. MOVEMENT IS  KEY!  YOU ARE EXPECTED TO BE OUT OF BED WITHIN 4 HOURS OF ADMISSION TO YOUR PATIENT ROOM.  SITTING IN THE RECLINER THROUGHOUT THE DAY IS IMPORTANT FOR DRINKING FLUIDS AND MOVING GAS THROUGHOUT THE GI TRACT.  COMPRESSION STOCKINGS SHOULD BE WORN [facility] STAY UNLESS YOU ARE WALKING.   INCENTIVE SPIROMETER SHOULD BE USED EVERY HOUR WHILE AWAKE TO DECREASE POST-OPERATIVE COMPLICATIONS SUCH AS PNEUMONIA.  WHEN DISCHARGED HOME, IT IS IMPORTANT TO CONTINUE TO WALK EVERY HOUR AND USE THE INCENTIVE SPIROMETER EVERY HOUR.      CLEAR LIQUID DIET   Foods Allowed                                                                     Foods Excluded  Coffee and tea, regular and decaf                             liquids that you cannot  Plain Jell-O in any flavor                                             see through such as: Fruit ices (not with fruit pulp)                                     milk, soups,  orange juice  Iced Popsicles                                    All solid food Carbonated beverages, regular and diet                                    Cranberry, grape and apple juices Sports drinks like Gatorade Lightly seasoned clear broth or consume(fat free) Sugar, honey syrup  Sample Menu Breakfast                                Lunch                                     Supper Cranberry juice                    Beef broth                            Chicken broth Jell-O                                     Grape juice                           Apple juice Coffee or tea                        Jell-O                                      Popsicle                                                Coffee or tea                        Coffee or tea  _____________________________________________________________________        WHAT IS A BLOOD TRANSFUSION? Blood Transfusion Information  A transfusion is the replacement of blood or some of its parts. Blood is made up of multiple  cells which provide different functions.  Red blood cells carry oxygen and are used for blood loss replacement.  White blood cells fight against infection.  Platelets control bleeding.  Plasma helps clot blood.  Other blood products are available for specialized needs, such as hemophilia or other clotting disorders. BEFORE THE TRANSFUSION  Who gives blood for transfusions?   Healthy volunteers who are fully evaluated to make sure their blood is safe. This is blood bank blood. Transfusion therapy is the safest it has ever been in the practice of medicine. Before blood is taken from a donor, a complete history is taken to make sure that person has no history of diseases nor engages in risky social behavior (examples are intravenous drug use or sexual  activity with multiple partners). The donor's travel history is screened to minimize risk of transmitting infections, such as malaria. The donated blood is tested for signs of infectious diseases, such as HIV and hepatitis. The blood is then tested to be sure it is compatible with you in order to minimize the chance of a transfusion reaction. If you or a relative donates blood, this is often done in anticipation of surgery and is not appropriate for emergency situations. It takes many days to process the donated blood. RISKS AND COMPLICATIONS Although transfusion therapy is very safe and saves many lives, the main dangers of transfusion include:   Getting an infectious disease.  Developing a transfusion reaction. This is an allergic reaction to something in the blood you were given. Every precaution is taken to prevent this. The decision to have a blood transfusion has been considered carefully by your caregiver before blood is given. Blood is not given unless the benefits outweigh the risks. AFTER THE TRANSFUSION  Right after receiving a blood transfusion, you will usually feel much better and more energetic. This is especially true if your red  blood cells have gotten low (anemic). The transfusion raises the level of the red blood cells which carry oxygen, and this usually causes an energy increase.  The nurse administering the transfusion will monitor you carefully for complications. HOME CARE INSTRUCTIONS  No special instructions are needed after a transfusion. You may find your energy is better. Speak with your caregiver about any limitations on activity for underlying diseases you may have. SEEK MEDICAL CARE IF:   Your condition is not improving after your transfusion.  You develop redness or irritation at the intravenous (IV) site. SEEK IMMEDIATE MEDICAL CARE IF:  Any of the following symptoms occur over the next 12 hours:  Shaking chills.  You have a temperature by mouth above 102 F (38.9 C), not controlled by medicine.  Chest, back, or muscle pain.  People around you feel you are not acting correctly or are confused.  Shortness of breath or difficulty breathing.  Dizziness and fainting.  You get a rash or develop hives.  You have a decrease in urine output.  Your urine turns a dark color or changes to pink, red, or brown. Any of the following symptoms occur over the next 10 days:  You have a temperature by mouth above 102 F (38.9 C), not controlled by medicine.  Shortness of breath.  Weakness after normal activity.  The white part of the eye turns yellow (jaundice).  You have a decrease in the amount of urine or are urinating less often.  Your urine turns a dark color or changes to pink, red, or brown. Document Released: 06/08/2000 Document Revised: 09/03/2011 Document Reviewed: 01/26/2008 [facility] Patient Information 2014 [place], [place].  _______________________________________________________________________

## 2017-10-07 NOTE — Progress Notes (Signed)
Pre-Operative Nutrition Class:  Appt start time: 4268   End time:  1830.  Patient was seen on 10/07/2017 for Pre-Operative Bariatric Surgery Education [facility].   Pt questioned the safety of soy. Dietitian educated the class on soy as a lean protein and pt seemed to get aggravated and stated he will never agree with the dietitian so he will just drop it.   Surgery date:  Surgery type: sleeve Start weight at [facility]: 288.2 Weight today: 290.4  Samples given per MNT protocol. Patient educated on appropriate usage: Bariatric Advantage Multivitamin Lot # [id number] Exp: 10/20  Bariatric Fusion Calcium   Unjury Protein  Shake Lot # [id number] Exp: oct-3-19  The following the learning objectives were met by the patient during this course:  Identify Pre-Op Dietary Goals and will begin 2 weeks pre-operatively  Identify appropriate sources of fluids and proteins   State protein recommendations and appropriate sources pre and post-operatively  Identify Post-Operative Dietary Goals and will follow for 2 weeks post-operatively  Identify appropriate multivitamin and calcium sources  Describe the need for physical activity post-operatively and will follow MD recommendations  State when to call healthcare provider regarding medication questions or post-operative complications  Handouts given during class include:  Pre-Op Bariatric Surgery Diet Handout  Protein Shake Handout  Post-Op Bariatric Surgery Nutrition Handout  BELT Program Information Flyer  Support Group Information Flyer  [facility] Bariatric Supplements Price List  Follow-Up Plan: [facility] per MD.

## 2017-10-08 ENCOUNTER — Encounter (HOSPITAL_COMMUNITY)
Admission: RE | Admit: 2017-10-08 | Discharge: 2017-10-08 | Disposition: A | Discharge status: Home / Self Care | Payer: BLUE CROSS/BLUE SHIELD | Source: Ambulatory Visit | Attending: General Surgery | Admitting: General Surgery

## 2017-10-08 ENCOUNTER — Encounter (HOSPITAL_COMMUNITY): Payer: Self-pay

## 2017-10-08 ENCOUNTER — Other Ambulatory Visit: Payer: Self-pay

## 2017-10-08 DIAGNOSIS — R9431 Abnormal electrocardiogram [ECG] [EKG]: Secondary | ICD-10-CM | POA: Diagnosis not present

## 2017-10-08 DIAGNOSIS — Z01810 Encounter for preprocedural cardiovascular examination: Secondary | ICD-10-CM | POA: Diagnosis present

## 2017-10-08 DIAGNOSIS — I10 Essential (primary) hypertension: Secondary | ICD-10-CM | POA: Diagnosis not present

## 2017-10-08 DIAGNOSIS — Z01812 Encounter for preprocedural laboratory examination: Secondary | ICD-10-CM | POA: Insufficient documentation

## 2017-10-08 HISTORY — DX: Myasthenia gravis without (acute) exacerbation: G70.00

## 2017-10-08 LAB — COMPREHENSIVE METABOLIC PANEL
ALBUMIN: 3.6 g/dL (ref 3.5–5.0)
ALK PHOS: 74 U/L (ref 38–126)
ALT: 25 U/L (ref 17–63)
ANION GAP: 10 (ref 5–15)
AST: 22 U/L (ref 15–41)
BUN: 21 mg/dL — AB (ref 6–20)
CALCIUM: 9.1 mg/dL (ref 8.9–10.3)
CO2: 26 mmol/L (ref 22–32)
CREATININE: 1.03 mg/dL (ref 0.61–1.24)
Chloride: 105 mmol/L (ref 101–111)
GFR calc Af Amer: >60 mL/min (ref >60)
GFR calc non Af Amer: >60 mL/min (ref >60)
GLUCOSE: 109 mg/dL — AB (ref 65–99)
Potassium: 3.8 mmol/L (ref 3.5–5.1)
SODIUM: 141 mmol/L (ref 135–145)
TOTAL PROTEIN: 6.5 g/dL (ref 6.5–8.1)
Total Bilirubin: 0.6 mg/dL (ref 0.3–1.2)

## 2017-10-08 LAB — CBC WITH DIFFERENTIAL/PLATELET
BASOS ABS: 0 10*3/uL (ref 0.0–0.1)
BASOS PCT: 0 %
Eosinophils Absolute: 0.1 10*3/uL (ref 0.0–0.7)
Eosinophils Relative: 2 %
HCT: 41 % (ref 39.0–52.0)
HEMOGLOBIN: 12.8 g/dL — AB (ref 13.0–17.0)
Lymphocytes Relative: 16 %
Lymphs Abs: 1.2 10*3/uL (ref 0.7–4.0)
MCH: 25.9 pg — ABNORMAL LOW (ref 26.0–34.0)
MCHC: 31.2 g/dL (ref 30.0–36.0)
MCV: 82.8 fL (ref 78.0–100.0)
MONOS PCT: 8 %
Monocytes Absolute: 0.6 10*3/uL (ref 0.1–1.0)
NEUTROS ABS: 5.5 10*3/uL (ref 1.7–7.7)
NEUTROS PCT: 74 %
Platelets: 154 10*3/uL (ref 150–400)
RBC: 4.95 MIL/uL (ref 4.22–5.81)
RDW: 15.4 % (ref 11.5–15.5)
WBC: 7.4 10*3/uL (ref 4.0–10.5)

## 2017-10-08 LAB — ABO/RH: ABO/RH(D): O POS

## 2017-10-14 ENCOUNTER — Inpatient Hospital Stay (HOSPITAL_COMMUNITY): Payer: BLUE CROSS/BLUE SHIELD | Admitting: Certified Registered Nurse Anesthetist

## 2017-10-14 ENCOUNTER — Inpatient Hospital Stay (HOSPITAL_COMMUNITY)
Admission: RE | Admit: 2017-10-14 | Discharge: 2017-10-15 | DRG: 621 | Disposition: A | Discharge status: Home / Self Care | Payer: BLUE CROSS/BLUE SHIELD | Attending: General Surgery | Admitting: General Surgery

## 2017-10-14 ENCOUNTER — Encounter (HOSPITAL_COMMUNITY)
Admission: RE | Disposition: A | Discharge status: Home / Self Care | Payer: Self-pay | Source: Home / Self Care | Attending: General Surgery

## 2017-10-14 ENCOUNTER — Encounter (HOSPITAL_COMMUNITY): Payer: Self-pay | Admitting: Emergency Medicine

## 2017-10-14 ENCOUNTER — Other Ambulatory Visit: Payer: Self-pay

## 2017-10-14 DIAGNOSIS — K317 Polyp of stomach and duodenum: Secondary | ICD-10-CM | POA: Diagnosis present

## 2017-10-14 DIAGNOSIS — K5710 Diverticulosis of small intestine without perforation or abscess without bleeding: Secondary | ICD-10-CM | POA: Diagnosis present

## 2017-10-14 DIAGNOSIS — Z881 Allergy status to other antibiotic agents status: Secondary | ICD-10-CM | POA: Diagnosis not present

## 2017-10-14 DIAGNOSIS — K219 Gastro-esophageal reflux disease without esophagitis: Secondary | ICD-10-CM | POA: Diagnosis present

## 2017-10-14 DIAGNOSIS — I10 Essential (primary) hypertension: Secondary | ICD-10-CM | POA: Diagnosis present

## 2017-10-14 DIAGNOSIS — M1990 Unspecified osteoarthritis, unspecified site: Secondary | ICD-10-CM | POA: Diagnosis present

## 2017-10-14 DIAGNOSIS — Z6841 Body Mass Index (BMI) 40.0 and over, adult: Secondary | ICD-10-CM

## 2017-10-14 DIAGNOSIS — E785 Hyperlipidemia, unspecified: Secondary | ICD-10-CM | POA: Diagnosis present

## 2017-10-14 DIAGNOSIS — Z79890 Hormone replacement therapy (postmenopausal): Secondary | ICD-10-CM | POA: Diagnosis not present

## 2017-10-14 DIAGNOSIS — F1121 Opioid dependence, in remission: Secondary | ICD-10-CM | POA: Diagnosis present

## 2017-10-14 DIAGNOSIS — M479 Spondylosis, unspecified: Secondary | ICD-10-CM | POA: Diagnosis present

## 2017-10-14 DIAGNOSIS — G4733 Obstructive sleep apnea (adult) (pediatric): Secondary | ICD-10-CM | POA: Diagnosis present

## 2017-10-14 DIAGNOSIS — F1021 Alcohol dependence, in remission: Secondary | ICD-10-CM | POA: Diagnosis present

## 2017-10-14 DIAGNOSIS — Z79899 Other long term (current) drug therapy: Secondary | ICD-10-CM | POA: Diagnosis not present

## 2017-10-14 DIAGNOSIS — Z811 Family history of alcohol abuse and dependence: Secondary | ICD-10-CM

## 2017-10-14 DIAGNOSIS — Z8261 Family history of arthritis: Secondary | ICD-10-CM

## 2017-10-14 DIAGNOSIS — Z8249 Family history of ischemic heart disease and other diseases of the circulatory system: Secondary | ICD-10-CM

## 2017-10-14 DIAGNOSIS — G7000 Myasthenia gravis without (acute) exacerbation: Secondary | ICD-10-CM | POA: Diagnosis present

## 2017-10-14 DIAGNOSIS — Z9079 Acquired absence of other genital organ(s): Secondary | ICD-10-CM

## 2017-10-14 DIAGNOSIS — Z87891 Personal history of nicotine dependence: Secondary | ICD-10-CM | POA: Diagnosis not present

## 2017-10-14 HISTORY — PX: LAPAROSCOPIC GASTRIC SLEEVE RESECTION: SHX5895

## 2017-10-14 LAB — CBC
HEMATOCRIT: 43.9 % (ref 39.0–52.0)
Hemoglobin: 13.9 g/dL (ref 13.0–17.0)
MCH: 26.2 pg (ref 26.0–34.0)
MCHC: 31.7 g/dL (ref 30.0–36.0)
MCV: 82.8 fL (ref 78.0–100.0)
PLATELETS: 151 10*3/uL (ref 150–400)
RBC: 5.3 MIL/uL (ref 4.22–5.81)
RDW: 15.5 % (ref 11.5–15.5)
WBC: 12.5 10*3/uL — ABNORMAL HIGH (ref 4.0–10.5)

## 2017-10-14 LAB — TYPE AND SCREEN
ABO/RH(D): O POS
Antibody Screen: NEGATIVE

## 2017-10-14 LAB — CREATININE, SERUM: Creatinine, Ser: 1.14 mg/dL (ref 0.61–1.24)

## 2017-10-14 SURGERY — GASTRECTOMY, SLEEVE, LAPAROSCOPIC
Anesthesia: General | Site: Abdomen

## 2017-10-14 MED ORDER — PHENYLEPHRINE 40 MCG/ML (10ML) SYRINGE FOR IV PUSH (FOR BLOOD PRESSURE SUPPORT)
PREFILLED_SYRINGE | INTRAVENOUS | Status: DC | PRN
  Administered 2017-10-14 (×2): 80 ug via INTRAVENOUS

## 2017-10-14 MED ORDER — DOXAZOSIN MESYLATE 2 MG PO TABS
8.0000 mg | ORAL_TABLET | Freq: Every day | ORAL | Status: DC
  Administered 2017-10-15: 8 mg via ORAL
  Filled 2017-10-14: qty 4

## 2017-10-14 MED ORDER — SUGAMMADEX SODIUM 500 MG/5ML IV SOLN
INTRAVENOUS | Status: AC
  Filled 2017-10-14: qty 5

## 2017-10-14 MED ORDER — FENTANYL CITRATE (PF) 100 MCG/2ML IJ SOLN
25.0000 ug | INTRAMUSCULAR | Status: DC | PRN
  Administered 2017-10-14 (×2): 50 ug via INTRAVENOUS

## 2017-10-14 MED ORDER — LIDOCAINE HCL 2 % IJ SOLN
INTRAMUSCULAR | Status: AC
  Filled 2017-10-14: qty 20

## 2017-10-14 MED ORDER — BUPIVACAINE HCL (PF) 0.25 % IJ SOLN
INTRAMUSCULAR | Status: AC
  Filled 2017-10-14: qty 30

## 2017-10-14 MED ORDER — KETAMINE HCL 10 MG/ML IJ SOLN
INTRAMUSCULAR | Status: DC | PRN
  Administered 2017-10-14: 20 mg via INTRAVENOUS

## 2017-10-14 MED ORDER — ROCURONIUM BROMIDE 50 MG/5ML IV SOSY
PREFILLED_SYRINGE | INTRAVENOUS | Status: DC | PRN
  Administered 2017-10-14: 10 mg via INTRAVENOUS
  Administered 2017-10-14: 5 mg via INTRAVENOUS
  Administered 2017-10-14 (×2): 10 mg via INTRAVENOUS
  Administered 2017-10-14: 40 mg via INTRAVENOUS

## 2017-10-14 MED ORDER — DEXAMETHASONE SODIUM PHOSPHATE 4 MG/ML IJ SOLN: 4.0000 mg | INTRAMUSCULAR | Status: DC

## 2017-10-14 MED ORDER — SODIUM CHLORIDE 0.9 % IJ SOLN
INTRAMUSCULAR | Status: AC
  Filled 2017-10-14: qty 50

## 2017-10-14 MED ORDER — GABAPENTIN 300 MG PO CAPS
300.0000 mg | ORAL_CAPSULE | ORAL | Status: DC
  Filled 2017-10-14: qty 1

## 2017-10-14 MED ORDER — LIDOCAINE 2% (20 MG/ML) 5 ML SYRINGE
INTRAMUSCULAR | Status: DC | PRN
  Administered 2017-10-14: 60 mg via INTRAVENOUS

## 2017-10-14 MED ORDER — STERILE WATER FOR IRRIGATION IR SOLN
Status: DC | PRN
  Administered 2017-10-14: 2000 mL

## 2017-10-14 MED ORDER — ROCURONIUM BROMIDE 10 MG/ML (PF) SYRINGE
PREFILLED_SYRINGE | INTRAVENOUS | Status: AC
  Filled 2017-10-14: qty 5

## 2017-10-14 MED ORDER — BUPIVACAINE HCL (PF) 0.25 % IJ SOLN
INTRAMUSCULAR | Status: DC | PRN
  Administered 2017-10-14: 30 mL

## 2017-10-14 MED ORDER — BUPIVACAINE LIPOSOME 1.3 % IJ SUSP
20.0000 mL | Freq: Once | INTRAMUSCULAR | Status: AC
  Administered 2017-10-14: 20 mL
  Filled 2017-10-14: qty 20

## 2017-10-14 MED ORDER — SODIUM CHLORIDE 0.9 % IJ SOLN
INTRAMUSCULAR | Status: DC | PRN
  Administered 2017-10-14: 50 mL

## 2017-10-14 MED ORDER — SODIUM CHLORIDE 0.9 % IJ SOLN
INTRAMUSCULAR | Status: AC
  Filled 2017-10-14: qty 20

## 2017-10-14 MED ORDER — FENTANYL CITRATE (PF) 100 MCG/2ML IJ SOLN
12.5000 ug | INTRAMUSCULAR | Status: DC | PRN
  Filled 2017-10-14: qty 2

## 2017-10-14 MED ORDER — POTASSIUM CHLORIDE IN NACL 20-0.9 MEQ/L-% IV SOLN
INTRAVENOUS | Status: DC
  Administered 2017-10-14 – 2017-10-15 (×2): via INTRAVENOUS
  Filled 2017-10-14 (×2): qty 1000

## 2017-10-14 MED ORDER — SCOPOLAMINE 1 MG/3DAYS TD PT72
1.0000 | MEDICATED_PATCH | TRANSDERMAL | Status: DC
  Administered 2017-10-14: 1.5 mg via TRANSDERMAL
  Filled 2017-10-14: qty 1

## 2017-10-14 MED ORDER — ACETAMINOPHEN 500 MG PO TABS
1000.0000 mg | ORAL_TABLET | Freq: Two times a day (BID) | ORAL | Status: DC | PRN
  Filled 2017-10-14: qty 2

## 2017-10-14 MED ORDER — LISINOPRIL 20 MG PO TABS
20.0000 mg | ORAL_TABLET | Freq: Every day | ORAL | Status: DC
  Administered 2017-10-15: 20 mg via ORAL
  Filled 2017-10-14: qty 1

## 2017-10-14 MED ORDER — LIDOCAINE 2% (20 MG/ML) 5 ML SYRINGE
INTRAMUSCULAR | Status: DC | PRN
  Administered 2017-10-14: 1 mg/kg/h via INTRAVENOUS

## 2017-10-14 MED ORDER — PROPOFOL 10 MG/ML IV BOLUS
INTRAVENOUS | Status: DC | PRN
  Administered 2017-10-14: 200 mg via INTRAVENOUS

## 2017-10-14 MED ORDER — CHLORHEXIDINE GLUCONATE CLOTH 2 % EX PADS: 6.0000 | MEDICATED_PAD | Freq: Once | CUTANEOUS | Status: DC

## 2017-10-14 MED ORDER — CELECOXIB 200 MG PO CAPS
400.0000 mg | ORAL_CAPSULE | ORAL | Status: AC
  Administered 2017-10-14: 400 mg via ORAL
  Filled 2017-10-14: qty 2

## 2017-10-14 MED ORDER — ACETAMINOPHEN 160 MG/5ML PO SOLN
650.0000 mg | Freq: Four times a day (QID) | ORAL | Status: DC
  Administered 2017-10-14 – 2017-10-15 (×3): 650 mg via ORAL
  Filled 2017-10-14 (×3): qty 20.3

## 2017-10-14 MED ORDER — LIDOCAINE 2% (20 MG/ML) 5 ML SYRINGE
INTRAMUSCULAR | Status: AC
  Filled 2017-10-14: qty 5

## 2017-10-14 MED ORDER — PROPOFOL 10 MG/ML IV BOLUS
INTRAVENOUS | Status: AC
Start: 2017-10-14 — End: ?
  Filled 2017-10-14: qty 20

## 2017-10-14 MED ORDER — SUCCINYLCHOLINE CHLORIDE 200 MG/10ML IV SOSY
PREFILLED_SYRINGE | INTRAVENOUS | Status: AC
  Filled 2017-10-14: qty 10

## 2017-10-14 MED ORDER — PANTOPRAZOLE SODIUM 40 MG PO TBEC
40.0000 mg | DELAYED_RELEASE_TABLET | Freq: Every day | ORAL | Status: DC
  Administered 2017-10-15: 40 mg via ORAL
  Filled 2017-10-14: qty 1

## 2017-10-14 MED ORDER — ONDANSETRON HCL 4 MG/2ML IJ SOLN: 4.0000 mg | INTRAMUSCULAR | Status: DC | PRN

## 2017-10-14 MED ORDER — CEFOTETAN DISODIUM-DEXTROSE 2-2.08 GM-%(50ML) IV SOLR
2.0000 g | INTRAVENOUS | Status: AC
  Administered 2017-10-14: 2 g via INTRAVENOUS
  Filled 2017-10-14: qty 50

## 2017-10-14 MED ORDER — FENTANYL CITRATE (PF) 250 MCG/5ML IJ SOLN
INTRAMUSCULAR | Status: DC | PRN
  Administered 2017-10-14: 25 ug via INTRAVENOUS
  Administered 2017-10-14: 50 ug via INTRAVENOUS
  Administered 2017-10-14: 25 ug via INTRAVENOUS
  Administered 2017-10-14: 50 ug via INTRAVENOUS

## 2017-10-14 MED ORDER — LACTATED RINGERS IV SOLN
INTRAVENOUS | Status: DC
  Administered 2017-10-14 (×2): via INTRAVENOUS

## 2017-10-14 MED ORDER — ONDANSETRON HCL 4 MG/2ML IJ SOLN
INTRAMUSCULAR | Status: AC
  Filled 2017-10-14: qty 2

## 2017-10-14 MED ORDER — ENOXAPARIN SODIUM 30 MG/0.3ML ~~LOC~~ SOLN
30.0000 mg | Freq: Two times a day (BID) | SUBCUTANEOUS | Status: DC
  Administered 2017-10-14 – 2017-10-15 (×2): 30 mg via SUBCUTANEOUS
  Filled 2017-10-14 (×2): qty 0.3

## 2017-10-14 MED ORDER — MODAFINIL 200 MG PO TABS
200.0000 mg | ORAL_TABLET | Freq: Every day | ORAL | Status: DC
  Administered 2017-10-15: 200 mg via ORAL
  Filled 2017-10-14: qty 1

## 2017-10-14 MED ORDER — MIDAZOLAM HCL 5 MG/5ML IJ SOLN
INTRAMUSCULAR | Status: DC | PRN
  Administered 2017-10-14 (×2): 1 mg via INTRAVENOUS

## 2017-10-14 MED ORDER — PHENYLEPHRINE 40 MCG/ML (10ML) SYRINGE FOR IV PUSH (FOR BLOOD PRESSURE SUPPORT)
PREFILLED_SYRINGE | INTRAVENOUS | Status: AC
  Filled 2017-10-14: qty 10

## 2017-10-14 MED ORDER — MIDAZOLAM HCL 2 MG/2ML IJ SOLN
INTRAMUSCULAR | Status: AC
  Filled 2017-10-14: qty 2

## 2017-10-14 MED ORDER — LACTATED RINGERS IR SOLN
Status: DC | PRN
  Administered 2017-10-14: 1000 mL

## 2017-10-14 MED ORDER — PREMIER PROTEIN SHAKE: 2.0000 [oz_av] | ORAL | Status: DC

## 2017-10-14 MED ORDER — FENTANYL CITRATE (PF) 250 MCG/5ML IJ SOLN
INTRAMUSCULAR | Status: AC
  Filled 2017-10-14: qty 5

## 2017-10-14 MED ORDER — PROPOFOL 10 MG/ML IV BOLUS
INTRAVENOUS | Status: AC
  Filled 2017-10-14: qty 20

## 2017-10-14 MED ORDER — HYDROCHLOROTHIAZIDE 25 MG PO TABS
25.0000 mg | ORAL_TABLET | Freq: Every day | ORAL | Status: DC
  Administered 2017-10-15: 25 mg via ORAL
  Filled 2017-10-14: qty 1

## 2017-10-14 MED ORDER — LIDOCAINE 2% (20 MG/ML) 5 ML SYRINGE: INTRAMUSCULAR | Status: DC | PRN

## 2017-10-14 MED ORDER — GABAPENTIN 300 MG PO CAPS
600.0000 mg | ORAL_CAPSULE | Freq: Three times a day (TID) | ORAL | Status: DC
  Administered 2017-10-14 – 2017-10-15 (×2): 600 mg via ORAL
  Filled 2017-10-14 (×2): qty 2

## 2017-10-14 MED ORDER — METOPROLOL TARTRATE 50 MG PO TABS
50.0000 mg | ORAL_TABLET | Freq: Two times a day (BID) | ORAL | Status: DC
  Administered 2017-10-14 – 2017-10-15 (×2): 50 mg via ORAL
  Filled 2017-10-14 (×2): qty 1

## 2017-10-14 MED ORDER — DEXAMETHASONE SODIUM PHOSPHATE 10 MG/ML IJ SOLN
INTRAMUSCULAR | Status: AC
  Filled 2017-10-14: qty 1

## 2017-10-14 MED ORDER — LISINOPRIL-HYDROCHLOROTHIAZIDE 20-25 MG PO TABS: 1.0000 | ORAL_TABLET | Freq: Every day | ORAL | Status: DC

## 2017-10-14 MED ORDER — EVICEL 5 ML EX KIT
PACK | Freq: Once | CUTANEOUS | Status: DC
Start: 2017-10-14 — End: 2017-10-14
  Filled 2017-10-14: qty 1

## 2017-10-14 MED ORDER — METOPROLOL TARTRATE 5 MG/5ML IV SOLN: 5.0000 mg | Freq: Four times a day (QID) | INTRAVENOUS | Status: DC | PRN

## 2017-10-14 MED ORDER — FENTANYL CITRATE (PF) 100 MCG/2ML IJ SOLN
INTRAMUSCULAR | Status: AC
  Filled 2017-10-14: qty 2

## 2017-10-14 MED ORDER — OXYCODONE HCL 5 MG/5ML PO SOLN: 5.0000 mg | ORAL | Status: DC | PRN

## 2017-10-14 MED ORDER — SUGAMMADEX SODIUM 200 MG/2ML IV SOLN
INTRAVENOUS | Status: DC | PRN
  Administered 2017-10-14: 400 mg via INTRAVENOUS

## 2017-10-14 MED ORDER — EPHEDRINE SULFATE-NACL 50-0.9 MG/10ML-% IV SOSY
PREFILLED_SYRINGE | INTRAVENOUS | Status: DC | PRN
  Administered 2017-10-14 (×4): 10 mg via INTRAVENOUS

## 2017-10-14 MED ORDER — ONDANSETRON HCL 4 MG/2ML IJ SOLN
INTRAMUSCULAR | Status: DC | PRN
  Administered 2017-10-14: 4 mg via INTRAVENOUS

## 2017-10-14 MED ORDER — HEPARIN SODIUM (PORCINE) 5000 UNIT/ML IJ SOLN
5000.0000 [IU] | INTRAMUSCULAR | Status: AC
  Administered 2017-10-14: 5000 [IU] via SUBCUTANEOUS
  Filled 2017-10-14: qty 1

## 2017-10-14 MED ORDER — DEXAMETHASONE SODIUM PHOSPHATE 10 MG/ML IJ SOLN
INTRAMUSCULAR | Status: DC | PRN
  Administered 2017-10-14: 5 mg via INTRAVENOUS

## 2017-10-14 MED ORDER — APREPITANT 40 MG PO CAPS
40.0000 mg | ORAL_CAPSULE | ORAL | Status: AC
  Administered 2017-10-14: 40 mg via ORAL
  Filled 2017-10-14: qty 1

## 2017-10-14 SURGICAL SUPPLY — 57 items
ADH SKN CLS APL DERMABOND .7 (GAUZE/BANDAGES/DRESSINGS) ×1
APPLIER CLIP ROT 10 11.4 M/L (STAPLE)
APPLIER CLIP ROT 13.4 12 LRG (CLIP)
APR CLP LRG 13.4X12 ROT 20 MLT (CLIP)
APR CLP MED LRG 11.4X10 (STAPLE)
BLADE SURG SZ11 CARB STEEL (BLADE) ×2 IMPLANT
CABLE HIGH FREQUENCY MONO STRZ (ELECTRODE) ×2 IMPLANT
CHLORAPREP W/TINT 26ML (MISCELLANEOUS) ×2 IMPLANT
CLIP APPLIE ROT 10 11.4 M/L (STAPLE) IMPLANT
CLIP APPLIE ROT 13.4 12 LRG (CLIP) IMPLANT
DERMABOND ADVANCED (GAUZE/BANDAGES/DRESSINGS) ×1
DERMABOND ADVANCED .7 DNX12 (GAUZE/BANDAGES/DRESSINGS) ×1 IMPLANT
DEVICE SUT QUICK LOAD TK 5 (STAPLE) IMPLANT
DEVICE SUT TI-KNOT TK 5X26 (MISCELLANEOUS) IMPLANT
DEVICE SUTURE ENDOST 10MM (ENDOMECHANICALS) IMPLANT
DRAPE UTILITY XL STRL (DRAPES) ×4 IMPLANT
ELECT REM PT RETURN 15FT ADLT (MISCELLANEOUS) ×2 IMPLANT
GAUZE SPONGE 4X4 12PLY STRL (GAUZE/BANDAGES/DRESSINGS) IMPLANT
GLOVE BIOGEL PI IND STRL 7.5 (GLOVE) ×1 IMPLANT
GLOVE BIOGEL PI INDICATOR 7.5 (GLOVE) ×1
GLOVE ECLIPSE 7.5 STRL STRAW (GLOVE) ×2 IMPLANT
GOWN STRL REUS W/TWL XL LVL3 (GOWN DISPOSABLE) ×8 IMPLANT
GRASPER SUT TROCAR 14GX15 (MISCELLANEOUS) IMPLANT
HOVERMATT SINGLE USE (MISCELLANEOUS) ×2 IMPLANT
KIT BASIN OR (CUSTOM PROCEDURE TRAY) ×2 IMPLANT
MARKER SKIN DUAL TIP RULER LAB (MISCELLANEOUS) ×2 IMPLANT
NEEDLE SPNL 22GX3.5 QUINCKE BK (NEEDLE) ×2 IMPLANT
PACK UNIVERSAL I (CUSTOM PROCEDURE TRAY) ×2 IMPLANT
RELOAD STAPLER BLUE 60MM (STAPLE) IMPLANT
RELOAD STAPLER GOLD 60MM (STAPLE) ×3 IMPLANT
RELOAD STAPLER GREEN 60MM (STAPLE) ×3 IMPLANT
SCISSORS LAP 5X45 EPIX DISP (ENDOMECHANICALS) IMPLANT
SET IRRIG TUBING LAPAROSCOPIC (IRRIGATION / IRRIGATOR) ×2 IMPLANT
SHEARS HARMONIC ACE PLUS 45CM (MISCELLANEOUS) ×2 IMPLANT
SLEEVE ADV FIXATION 5X100MM (TROCAR) ×2 IMPLANT
SLEEVE GASTRECTOMY 36FR VISIGI (MISCELLANEOUS) ×2 IMPLANT
SOLUTION ANTI FOG 6CC (MISCELLANEOUS) ×2 IMPLANT
SPONGE LAP 18X18 X RAY DECT (DISPOSABLE) ×2 IMPLANT
STAPLER ECHELON LONG 60 440 (INSTRUMENTS) ×2 IMPLANT
STAPLER RELOAD BLUE 60MM (STAPLE)
STAPLER RELOAD GOLD 60MM (STAPLE) ×6
STAPLER RELOAD GREEN 60MM (STAPLE) ×6
SUT MNCRL AB 4-0 PS2 18 (SUTURE) ×2 IMPLANT
SUT SURGIDAC NAB ES-9 0 48 120 (SUTURE) ×2 IMPLANT
SUT VIC AB 0 BRD 54 (SUTURE) IMPLANT
SYR 10ML ECCENTRIC (SYRINGE) ×2 IMPLANT
SYR 20CC LL (SYRINGE) ×4 IMPLANT
SYSTEM WECK SHIELD CLOSURE (TROCAR) ×2 IMPLANT
TIP RIGID 35CM EVICEL (HEMOSTASIS) ×2 IMPLANT
TOWEL OR 17X26 10 PK STRL BLUE (TOWEL DISPOSABLE) ×2 IMPLANT
TOWEL OR NON WOVEN STRL DISP B (DISPOSABLE) ×2 IMPLANT
TROCAR ADV FIXATION 5X100MM (TROCAR) ×2 IMPLANT
TROCAR BLADELESS 15MM (ENDOMECHANICALS) ×2 IMPLANT
TROCAR BLADELESS OPT 5 100 (ENDOMECHANICALS) ×2 IMPLANT
TUBING CONNECTING 10 (TUBING) ×4 IMPLANT
TUBING ENDO SMARTCAP PENTAX (MISCELLANEOUS) ×2 IMPLANT
TUBING INSUF HEATED (TUBING) ×2 IMPLANT

## 2017-10-14 NOTE — Progress Notes (Signed)
Pt decided to take off ring for surgery. Ring given to wife.

## 2017-10-14 NOTE — H&P (Signed)
History of Present Illness  The patient is a 57 year old male who presents with obesity. Patient is a pleasant 57 year old male referred by Mauricio Po for consideration for surgical treatment for morbid obesity. 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 He has a many year history of obesity which has been resistant to multiple attempts at supervised and unsupervised diet programs. He is able to lose a significant amount of weight, up to 60 pounds at a time but then experiences progressive weight regain. 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 He is able to walk short distances but for longer distances is in a wheelchair. He also suffers from myasthenia gravis with some proximal muscle weakness that complicates this issue and his weight on top of this severely limits his mobility. He also has significant other comorbidities including obstructive sleep apnea requiring CPAP, hypertension and hyperlipidemia. He actually went through a complete preoperative workup and evaluation at [facility] and was ready to proceed with sleeve gastrectomy. However due to a colonoscopy that he had to have due to a positive cologuard test (ultimately showing only benign polyps), he was told his surgery would have to be delayed number of months and he became frustrated with the scheduling process. He has remained however committed to trying to get control of his weight in order to be more mobile and try to improve his comorbidities. Patient also has a significant history of narcotic addiction and alcoholism but has not had alcohol for over 30 years and drug free for 10 years and is very frank and upfront about this. 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 Without these drugs he states he would be essentially  immobile. I reviewed all his records including lab and vitamin levels, normal upper GI series except for small duodenal diverticulum and he has had psychologic and dietary clearance previously.   Past Surgical History  TURP  Vasectomy   Diagnostic Studies History  Colonoscopy  within last year  Allergies  Avelox *FLUOROQUINOLONES*  Allergies Reconciled   Medication History  Omeprazole (40MG  Capsule DR, Oral) Active. Diclofenac Sodium (75MG  Tablet DR, Oral) Active. Metoprolol Succinate ER (50MG  Tablet ER 24HR, Oral) Active. Lisinopril-Hydrochlorothiazide (20-25MG  Tablet, Oral) Active. Doxazosin Mesylate (8MG  Tablet, Oral) Active. Gabapentin (400MG  Capsule, Oral) Active. Provigil (200MG  Tablet, Oral) Active. Testosterone Enanthate (200MG /ML Solution, Intramuscular) Active. Medications Reconciled  Social History  Alcohol use  Remotely quit alcohol use. Caffeine use  Coffee. Illicit drug use  Remotely quit drug use. Tobacco use  Former smoker.  Family History  Alcohol Abuse  Mother. Arthritis  Mother. Cancer  Mother. Diabetes Mellitus  Father. Hypertension  Father, Mother. Respiratory Condition  Mother.  Other Problems  Alcohol Abuse  Arthritis  Back Pain  Enlarged Prostate  Gastroesophageal Reflux Disease  High blood pressure  Hypercholesterolemia     Review of Systems  General Not Present- Appetite Loss, Chills, Fatigue, Fever, Night Sweats, Weight Gain and Weight Loss. Skin Not Present- Change in Wart/Mole, Dryness, Hives, Jaundice, New Lesions, Non-Healing Wounds, Rash and Ulcer. HEENT Not Present- Earache, Hearing Loss, Hoarseness, Nose Bleed, Oral Ulcers, Ringing in the Ears, Seasonal Allergies, Sinus Pain, Sore Throat, Visual Disturbances, Wears glasses/contact lenses and Yellow Eyes. Respiratory Not Present- Bloody sputum, Chronic Cough, Difficulty Breathing, Snoring and Wheezing. Breast Not Present- Breast Mass, Breast Pain,  Nipple Discharge and Skin Changes. Cardiovascular  Not Present- Chest Pain, Difficulty Breathing Lying Down, Leg Cramps, Palpitations, Rapid Heart Rate, Shortness of Breath and Swelling of Extremities. Gastrointestinal Not Present- Abdominal Pain, Bloating, Bloody Stool, Change in Bowel Habits, Chronic diarrhea, Constipation, Difficulty Swallowing, Excessive gas, Gets full quickly at meals, Hemorrhoids, Indigestion, Nausea, Rectal Pain and Vomiting. Male Genitourinary Present- Change in Urinary Stream. Not Present- Blood in Urine, Frequency, Impotence, Nocturia, Painful Urination, Urgency and Urine Leakage. Musculoskeletal Present- Back Pain, Joint Pain, Joint Stiffness and Muscle Weakness. Not Present- Muscle Pain and Swelling of Extremities. Neurological Present- Trouble walking and Weakness. Not Present- Decreased Memory, Fainting, Headaches, Numbness, Seizures, Tingling and Tremor. Psychiatric Not Present- Anxiety, Bipolar, Change in Sleep Pattern, Depression, Fearful and Frequent crying. Endocrine Not Present- Cold Intolerance, Excessive Hunger, Hair Changes, Heat Intolerance, Hot flashes and New Diabetes. Hematology Not Present- Blood Thinners, Easy Bruising, Excessive bleeding, Gland problems, HIV and Persistent Infections.  Vitals   BP 108/70   Pulse 71   Temp 98 F (36.7 C) (Oral)   Resp 18   Ht 5\' 7"  (1.702 m)   Wt 128.5 kg (283 lb 3.2 oz)   SpO2 97%   BMI 44.36 kg/m         Physical Exam  The physical exam findings are as follows: Note:General: Alert, pleasant morbidly obese Caucasian male, in no distress Skin: Warm and dry without rash or infection. HEENT: No palpable masses or thyromegaly. Sclera nonicteric. Pupils equal round and reactive. Oropharynx clear. Lymph nodes: No cervical, supraclavicular, or inguinal nodes palpable. Lungs: Breath sounds clear and equal. No wheezing or increased work of breathing. Cardiovascular: Regular rate and rhythm without murmer. No  JVD or edema. Peripheral pulses intact. No carotid bruits. Abdomen: Nondistended. Soft and nontender. No masses palpable. No organomegaly. No palpable hernias. Extremities: 2-3+ lower extremity edema right greater than left. Limited mobility secondary to lower extremities but able to ambulate with slight difficulty and able to get up and down on the exam table independently. Neurologic: Alert and fully oriented. Gait limited by orthopedic issues. No focal weakness. Psychiatric: Normal mood and affect. Thought content appropriate with normal judgement and insight    Assessment & Plan  MORBID OBESITY WITH BMI OF 45.0-49.9, ADULT (E66.01) Impression: Patient with progressive morbid obesity unresponsive to multiple efforts at medical management who presents with a BMI of 48 and comorbidities of severe degenerative joint disease, obstructive sleep apnea, hypertension and dyslipidemia. I believe there would be very significant medical benefit from surgical weight loss. Patient has previously been [facility] which is what he prefers. We had a long discussion regarding his reflux and the fact that this could be made worse or even intolerable by sleeve. However his upper GI series is normal without reflux or hiatal hernia and his reflux seems to be more NSAID induced than typical reflux and possibly therefore less risk due to surgery. He would not be a candidate for gastric bypass due to need for these medications and therefore I think sleeve is a good choice versus no bariatric surgery due to his multiple comorbidities and significant potential benefit. We have discussed the nature of the problem and the risks of remaining morbidly obese. We discussed laparoscopic sleeve gastrectomy in detail including the nature of the procedure, expected hospitalization and recovery, and major risks of anesthetic complications, bleeding, blood clots and pulmonary emboli, leakage and infection  and long-term risks of stricture, , bowel obstruction, reflux, nutritional deficiencies, and failure to lose weight or weight regain.  We discussed these problems could lead to death. We discussed that the procedure is not reversible. We discussed possible need for conversion to gastric bypass or other procedure. The patient was given a complete consent form to review and all questions were answered. He has already been through a complete preoperative evaluation including psychological and nutrition evaluations, UGI series, and routine lab and x-rays. I reviewed all of these documents and no problems were found. He will see our nutritionist for a preop class and I believe we could work towards sched

## 2017-10-14 NOTE — Progress Notes (Signed)
Dr. Sampson GoonFitzgerald notified that pt had black coffee and ensure drink, finished both by 0830. Also notified that pt wishes to keep on ring.  He explained risks to pt. Pt wishes to keep ring on during surgery.

## 2017-10-14 NOTE — Op Note (Signed)
Preoperative Diagnosis: Morbid Obesity, HLD, HTN, GERD, OSA  Postoprative Diagnosis: Morbid Obesity, HLD, HTN, GERD, OSA  Procedure: Procedure(s): LAPAROSCOPIC GASTRIC SLEEVE RESECTION, UPPER ENDOSCOPY   Surgeon: Glenna FellowsHoxworth, Maxey Ransom T   Assistants: Ovidio Kinavid Newman  Anesthesia:  General endotracheal anesthesia  Indications: Patient with progressive morbid obesity unresponsive to multiple efforts at medical management who presents with a BMI of 48 and comorbidities of severe degenerative joint disease, obstructive sleep apnea, hypertension and dyslipidemia.  After extensive preoperative workup and discussion detailed elsewhere we have elected to proceed with laparoscopic sleeve gastrectomy for surgical treatment of his morbid obesity.   Procedure Detail: Patient was brought to the operating room, placed in the supine position on the operating table, and general endotracheal anesthesia induced.  He had received preoperative IV antibiotics and subcutaneous heparin.  PAS were placed.  The abdomen was widely sterilely prepped and draped.  Patient timeout was performed and correct procedure verified.  Access was obtained with a 5 mm Optiview trocar in the left upper quadrant without difficulty and pneumoperitoneum established.  There was no evidence of trocar injury.  Under direct vision a 5 mm trocar was placed laterally in the right upper quadrant, a 15 mm trocar at the base of the falciform ligament, a 5 mm trocar above the left of the umbilicus for the camera port.  Through a 5 mm subxiphoid site the [facility] retractor was placed in the left lobe of the liver elevated with excellent exposure of the stomach and the hiatus.  Finally an additional 5 mm trocar was placed in the left upper quadrant.  A bilateral T AP block was performed with dilute Exparel.  The patient had a normal preoperative upper GI series.  The hiatus was examined and I did not see evidence of a hiatal hernia.  The patient does have  reflux I double checked this by passing a sizing balloon orally into the stomach and with the 10 cc balloon inflated pullback snugly against the hiatus with no evidence of herniation.  The sizing tube was removed.  The sleeve was begun at the mid greater curve dissecting the vasculature near the stomach wall and the lesser sac entered.  Using the harmonic scalpel the dissection progressed proximally dividing vasculature and working up toward the fundus.  Short gastric vessels were divided with the harmonic scalpel.  The stomach was freed from its splenic attachments.  The esophageal fat pad was mobilized and the left crus was fully dissected and the hiatus clearly defined.  After complete dissection of the upper stomach we continued distally down the greater curve dissecting the vasculature with the harmonic scalpel until we are at a point measured just under 5 cm from the pylorus.  A few avascular posterior attachments were identified and the stomach was completely freed along its lesser curve vasculature.  The 2636 French VISI G-tube was passed orally and advanced with its tip to the pylorus.  It was positioned along the lesser curve with the stomach splayed out symmetrically and placed on continuous suction.  The sleeve was begun with an initial firing of the green load 60 mm Echelon stapler beginning about 5 cm from the pylorus and angling well away from the incisura.  A second firing of the green load 60 mm stapler continued the staple up to the incisura again allowing extra room away from the VisiG G-tube at this point.  A third firing of the green load stapler was then used to continue past the incisura gradually angling in toward  the tube but not tight against it.  The sleeve was then completed with 2 further firings of the gold load stapler and one final firing of the blue load stapler which was carried out just lateral to the esophageal fat pad a right angles to the stomach.  The sleeve was insufflated and  under saline irrigation there was no evidence of leak and there was a nice smooth tapering without twisting or narrowing.  The sleeve was desufflated and the VISI G-tube removed.  Dr. Ezzard Standing then performed upper endoscopy again showing a nice tubular tapering sleeve with no narrowing and no bleeding and no leak.  Additionally however a small, approximately 1.5 cm to 2 cm polyp was seen exactly at the pylorus.  We elected not to remove this at this time not wanting to complicate his sleep procedure and this could be removed at a later endoscopy.  The staple line of the sleeve was coated with Evicel.  The gastric specimen was removed through the 15 mm trocar site after dilating it slightly and the fascial defect was closed with 0 Vicryl with the Weck closure device.  The abdomen was inspected for hemostasis and there was no bleeding or evidence of injury or other problems.  All CO2 was evacuated and trochars removed after removing the Nathanson retractor under direct vision.  Skin incisions were closed with subarticular Monocryl and Dermabond.  Sponge needle and instrument counts were correct.    Findings: As above  Estimated Blood Loss:  less than 50 mL         Drains: None  Blood Given: none          Specimens: Greater curvature of stomach        Complications:  * No complications entered in OR log *         Disposition: PACU - hemodynamically stable.         Condition: stable

## 2017-10-14 NOTE — Interval H&P Note (Signed)
History and Physical Interval Note:  10/14/2017 10:51 AM  Anthony CowdenPhillip B Callan  has presented today for surgery, with the diagnosis of Morbid Obesity, HLD, HTN, GERD, OSA  The various methods of treatment have been discussed with the patient and family. After consideration of risks, benefits and other options for treatment, the patient has consented to  Procedure(s): LAPAROSCOPIC GASTRIC SLEEVE RESECTION, UPPER ENDO (N/A) as a surgical intervention .  The patient's history has been reviewed, patient examined, no change in status, stable for surgery.  I have reviewed the patient's chart and labs.  Questions were answered to the patient's satisfaction.     Lorne SkeensBenjamin T Caedmon Louque

## 2017-10-14 NOTE — Anesthesia Postprocedure Evaluation (Signed)
Anesthesia Post Note  Patient: Anthony Carlson  Procedure(s) Performed: LAPAROSCOPIC GASTRIC SLEEVE RESECTION, UPPER ENDOSCOPY (N/A Abdomen)     Patient location during evaluation: PACU Anesthesia Type: General Level of consciousness: awake and alert Pain management: pain level controlled Vital Signs Assessment: post-procedure vital signs reviewed and stable Respiratory status: spontaneous breathing, nonlabored ventilation, respiratory function stable and patient connected to nasal cannula oxygen Cardiovascular status: blood pressure returned to baseline and stable Postop Assessment: no apparent nausea or vomiting Anesthetic complications: no    Last Vitals:  Vitals:   10/14/17 1445 10/14/17 1500  BP: 136/75 (!) 162/85  Pulse: 85 86  Resp: 15 14  Temp: 36.9 C 36.6 C  SpO2: 99% 96%    Last Pain:  Vitals:   10/14/17 1445  TempSrc:   PainSc: 4                  Jackelyn Illingworth,W. EDMOND

## 2017-10-14 NOTE — Transfer of Care (Signed)
Immediate Anesthesia Transfer of Care Note  Patient: Nyoka Cowdenhillip B Adcox  Procedure(s) Performed: LAPAROSCOPIC GASTRIC SLEEVE RESECTION, UPPER ENDOSCOPY (N/A Abdomen)  Patient Location: PACU  Anesthesia Type:General  Level of Consciousness: awake, alert  and oriented  Airway & Oxygen Therapy: Patient Spontanous Breathing and Patient connected to face mask oxygen  Post-op Assessment: Report given to RN and Post -op Vital signs reviewed and stable  Post vital signs: Reviewed and stable  Last Vitals:  Vitals Value Taken Time  BP    Temp    Pulse 87 10/14/2017  1:46 PM  Resp 22 10/14/2017  1:46 PM  SpO2 99 % 10/14/2017  1:46 PM  Vitals shown include unvalidated device data.  Last Pain:  Vitals:   10/14/17 0949  TempSrc:   PainSc: 7       Patients Stated Pain Goal: 4 (10/14/17 0949)  Complications: No apparent anesthesia complications

## 2017-10-14 NOTE — Op Note (Signed)
Name:  Anthony Carlson MRN: [id number] Date of Surgery: 10/14/2017  Preop Diagnosis:  Morbid Obesity  Postop Diagnosis:  Morbid Obesity (Weight - 283, BMI - 44.3), S/P Gastric Sleeve resection, 1.5 cm gastric polyp near pylorus  Procedure:  Upper endoscopy  (Intraoperative)  Surgeon:  Ovidio Kinavid Chyla Schlender, M.D.  Anesthesia:  GET  Indications for procedure: Anthony Cowdenhillip B Gish is a 57 y.o. male whose primary care physician is [place], Rene Kocheregina F, NP and has completed a gastric sleeve resection today for weight loss by Dr. Johna SheriffHoxworth.  I am doing an intraoperative upper endoscopy to evaluate the gastric pouch after the sleeve gastrectomy.  Operative Note: The patient is under general anesthesia.  Dr. Johna SheriffHoxworth is laparoscoping the patient while I do an upper endoscopy to evaluate the stomach pouch.  With the patient intubated, I passed the Pentax upper endoscope without difficulty down the esophagus.  The esophagus was unremarkable.  The esophago-gastric junction was at 40 cm.    The mucosa of the stomach looked viable and the staple line was intact without bleeding.  I advanced the scope to the pylorus, but did not go through it.  While I insufflated the stomach pouch with air, Dr. Johna SheriffHoxworth  flooded the upper abdomen with saline to put the gastric pouch under saline.  There was no bubbling or evidence of a leak.  There was no evidence of narrowing of the pouch and the gastric sleeve looked tubular.  In the antrum, just before the pylorus, the patient had an approximate 1.5 cm pedunculated polyp.  The scope was then withdrawn.  The esophagus was unremarkable and the patient tolerated the endoscopy without difficulty.   1.5 cm polyp in antrum, just before pylorus  Ovidio Kinavid Octavis Sheeler, MD, [facility] Pager: [phone] Office phone:  [phone]

## 2017-10-14 NOTE — Discharge Instructions (Signed)
° ° ° °GASTRIC BYPASS/SLEEVE ° Home Care Instructions ° ° These instructions are to help you care for yourself when you go home. ° °Call: If you have any problems. °• Call [phone] and ask for the surgeon on call °• If you need immediate help, come to the ER at [facility].  °• Tell the ER staff that you are a new post-op gastric bypass or gastric sleeve patient °  °Signs and symptoms to report: • Severe vomiting or nausea °o If you cannot keep down clear liquids for longer than 1 day, call your surgeon  °• Abdominal pain that does not get better after taking your pain medication °• Fever over 100.4° F with chills °• Heart beating over 100 beats a minute °• Shortness of breath at rest °• Chest pain °•  Redness, swelling, drainage, or foul odor at incision (surgical) sites °•  If your incisions open or pull apart °• Swelling or pain in calf (lower leg) °• Diarrhea (Loose bowel movements that happen often), frequent watery, uncontrolled bowel movements °• Constipation, (no bowel movements for 3 days) if this happens: Pick one °o Milk of Magnesia, 2 tablespoons by mouth, 3 times a day for 2 days if needed °o Stop taking Milk of Magnesia once you have a bowel movement °o Call your doctor if constipation continues °Or °o Miralax  (instead of Milk of Magnesia) following the label instructions °o Stop taking Miralax once you have a bowel movement °o Call your doctor if constipation continues °• Anything you think is not normal °  °Normal side effects after surgery: • Unable to sleep at night or unable to focus °• Irritability or moody °• Being tearful (crying) or depressed °These are common complaints, possibly related to your anesthesia medications that put you to sleep, stress of surgery, and change in lifestyle.  This usually goes away a few weeks after surgery.  If these feelings continue, call your primary care doctor. °  °Wound Care: You may have surgical glue, steri-strips, or staples over your incisions after  surgery °• Surgical glue:  Looks like a clear film over your incisions and will wear off a little at a time °• Steri-strips: Strips of tape over your incisions. You may notice a yellowish color on the skin under the steri-strips. This is used to make the   steri-strips stick better. Do not pull the steri-strips off - let them fall off °• Staples: Staples may be removed before you leave the hospital °o If you go home with staples, call [facility], [phone] at for an appointment with your surgeon’s nurse to have staples removed 10 days after surgery. °• Showering: You may shower two (2) days after your surgery unless your surgeon tells you differently °o Wash gently around incisions with warm soapy water, rinse well, and gently pat dry  °o No tub baths until staples are removed, steri-strips fall off or glue is gone.  °  °Medications: • Medications should be liquid or crushed if larger than the size of a dime °• Extended release pills (medication that release a little bit at a time through the day) should NOT be crushed or cut. (examples include XL, ER, DR, SR) °• Depending on the size and number of medications you take, you may need to space (take a few throughout the day)/change the time you take your medications so that you do not over-fill your pouch (smaller stomach) °• Make sure you follow-up with your primary care doctor to   make medication changes needed during rapid weight loss and life-style changes °• If you have diabetes, follow up with the doctor that orders your diabetes medication(s) within one week after surgery and check your blood sugar regularly. °• Do not drive while taking prescription pain medication  °• It is ok to take Tylenol by the bottle instructions with your pain medicine or instead of your pain medicine as needed.  DO NOT TAKE NSAIDS (EXAMPLES OF NSAIDS:  IBUPROFREN/ NAPROXEN)  °Diet:                    First 2 Weeks ° You will see the dietician t about two (2) weeks  after your surgery. The dietician will increase the types of foods you can eat if you are handling liquids well: °• If you have severe vomiting or nausea and cannot keep down clear liquids lasting longer than 1 day, call your surgeon @ ([phone]) °Protein Shake °• Drink at least 2 ounces of shake 5-6 times per day °• Each serving of protein shakes (usually 8 - 12 ounces) should have: °o 15 grams of protein  °o And no more than 5 grams of carbohydrate  °• Goal for protein each day: °o Men = 80 grams per day °o Women = 60 grams per day °• Protein powder may be added to fluids such as non-fat milk or Lactaid milk or unsweetened Soy/Almond milk (limit to 35 grams added protein powder per serving) ° °Hydration °• Slowly increase the amount of water and other clear liquids as tolerated (See Acceptable Fluids) °• Slowly increase the amount of protein shake as tolerated  °•  Sip fluids slowly and throughout the day.  Do not use straws. °• May use sugar substitutes in small amounts (no more than 6 - 8 packets per day; i.e. Splenda) ° °Fluid Goal °• The first goal is to drink at least 8 ounces of protein shake/drink per day (or as directed by the nutritionist); some examples of protein shakes are Syntrax Nectar, Adkins Advantage, EAS Edge HP, and Unjury. See handout from pre-op Bariatric Education Class: °o Slowly increase the amount of protein shake you drink as tolerated °o You may find it easier to slowly sip shakes throughout the day °o It is important to get your proteins in first °• Your fluid goal is to drink 64 - 100 ounces of fluid daily °o It may take a few weeks to build up to this °• 32 oz (or more) should be clear liquids  °And  °• 32 oz (or more) should be full liquids (see below for examples) °• Liquids should not contain sugar, caffeine, or carbonation ° °Clear Liquids: °• Water or Sugar-free flavored water (i.e. Fruit H2O, Propel) °• Decaffeinated coffee or tea (sugar-free) °• Crystal Lite, Wyler’s Lite,  Minute Maid Lite °• Sugar-free Jell-O °• Bouillon or broth °• Sugar-free Popsicle:   *Less than 20 calories each; Limit 1 per day ° °Full Liquids: °Protein Shakes/Drinks + 2 choices per day of other full liquids °• Full liquids must be: °o No More Than 15 grams of Carbs per serving  °o No More Than 3 grams of Fat per serving °• Strained low-fat cream soup (except Cream of Potato or Tomato) °• Non-Fat milk °• Fat-free Lactaid Milk °• Unsweetened Soy Or Unsweetened Almond Milk °• Low Sugar yogurt (Dannon Lite & Fit, Greek yogurt; Oikos Triple Zero; Chobani Simply 100; Yoplait 100 calorie Greek - No Fruit on the Bottom) ° °  °Vitamins   and Minerals • Start 1 day after surgery unless otherwise directed by your surgeon °• 2 Chewable Bariatric Specific Multivitamin / Multimineral Supplement with iron (Example: Bariatric Advantage Multi EA) °• Chewable Calcium with Vitamin D-3 °(Example: 3 Chewable Calcium Plus 600 with Vitamin D-3) °o Take 500 mg three (3) times a day for a total of 1500 mg each day °o Do not take all 3 doses of calcium at one time as it may cause constipation, and you can only absorb 500 mg  at a time  °o Do not mix multivitamins containing iron with calcium supplements; take 2 hours apart °• Menstruating women and those with a history of anemia (a blood disease that causes weakness) may need extra iron °o Talk with your doctor to see if you need more iron °• Do not stop taking or change any vitamins or minerals until you talk to your dietitian or surgeon °• Your Dietitian and/or surgeon must approve all vitamin and mineral supplements °  °Activity and Exercise: Limit your physical activity as instructed by your doctor.  It is important to continue walking at home.  During this time, use these guidelines: °• Do not lift anything greater than ten (10) pounds for at least two (2) weeks °• Do not go back to work or drive until your surgeon says you can °• You may have sex when you feel comfortable  °o It is  VERY important for male patients to use a reliable birth control method; fertility often increases after surgery  °o All hormonal birth control will be ineffective for 30 days after surgery due to medications given during surgery a barrier method must be used. °o Do not get pregnant for at least 18 months °• Start exercising as soon as your doctor tells you that you can °o Make sure your doctor approves any physical activity °• Start with a simple walking program °• Walk 5-15 minutes each day, 7 days per week.  °• Slowly increase until you are walking 30-45 minutes per day °Consider joining our BELT program. [phone] or email [email or web address] °  °Special Instructions Things to remember: °• Use your CPAP when sleeping if this applies to you ° °• [facility] has two free Bariatric Surgery Support Groups that meet monthly °o The 3rd Thursday of each month, 6 pm, [facility]  °o The 2nd Friday of each month, 11:45 am in the private dining room in the basement of [place] °• It is very important to keep all follow up appointments with your surgeon, dietitian, primary care physician, and behavioral health practitioner °• Routine follow up schedule with your surgeon include appointments at 2-3 weeks, 6-8 weeks, 6 months, and 1 year at a minimum.  Your surgeon may request to see you more often.   °o After the first year, please follow up with your bariatric surgeon and dietitian at least once a year in order to maintain best weight loss results °[facility]: [phone] °[facility]: [phone] °Bariatric Nurse Coordinator: [phone] °  °   Reviewed and Endorsed  °by [facility] Patient Education Committee, June, 2016 °Edits Approved: Aug, 2018 ° ° ° °

## 2017-10-14 NOTE — Anesthesia Procedure Notes (Signed)
Procedure Name: Intubation Date/Time: 10/14/2017 11:38 AM Performed by: Maxwell Caul, CRNA Pre-anesthesia Checklist: Patient identified, Emergency Drugs available, Suction available and Patient being monitored Patient Re-evaluated:Patient Re-evaluated prior to induction Oxygen Delivery Method: Circle system utilized Preoxygenation: Pre-oxygenation with 100% oxygen Induction Type: IV induction Ventilation: Mask ventilation without difficulty Laryngoscope Size: Mac and 4 Grade View: Grade II Tube type: Oral Tube size: 7.5 mm Number of attempts: 1 Airway Equipment and Method: Stylet and Oral airway Placement Confirmation: ETT inserted through vocal cords under direct vision,  positive ETCO2 and breath sounds checked- equal and bilateral Secured at: 22 cm Tube secured with: Tape Dental Injury: Teeth and Oropharynx as per pre-operative assessment

## 2017-10-14 NOTE — Anesthesia Preprocedure Evaluation (Addendum)
Anesthesia Evaluation  Patient identified by MRN, date of birth, ID band Patient awake    Reviewed: Allergy & Precautions, H&P , NPO status , Patient's Chart, lab work & pertinent test results  Airway Mallampati: II  TM Distance: >3 FB Neck ROM: Full    Dental no notable dental hx. (+) Teeth Intact, Dental Advisory Given   Pulmonary neg pulmonary ROS, former smoker,    Pulmonary exam normal breath sounds clear to auscultation       Cardiovascular hypertension, Pt. on medications  Rhythm:Regular Rate:Normal     Neuro/Psych Depression  Neuromuscular disease    GI/Hepatic Neg liver ROS, GERD  Medicated and Controlled,  Endo/Other  Morbid obesity  Renal/GU negative Renal ROS  negative genitourinary   Musculoskeletal  (+) Arthritis ,   Abdominal   Peds  Hematology negative hematology ROS (+)   Anesthesia Other Findings   Reproductive/Obstetrics negative OB ROS                            Anesthesia Physical Anesthesia Plan  ASA: III  Anesthesia Plan: General   Post-op Pain Management:    Induction: Intravenous  PONV Risk Score and Plan: 3 and Ondansetron, Dexamethasone and Midazolam  Airway Management Planned: Oral ETT  Additional Equipment:   Intra-op Plan:   Post-operative Plan: Extubation in OR  Informed Consent: I have reviewed the patients History and Physical, chart, labs and discussed the procedure including the risks, benefits and alternatives for the proposed anesthesia with the patient or authorized representative who has indicated his/her understanding and acceptance.   Dental advisory given  Plan Discussed with: CRNA  Anesthesia Plan Comments:         Anesthesia Quick Evaluation

## 2017-10-15 ENCOUNTER — Encounter (HOSPITAL_COMMUNITY): Payer: Self-pay | Admitting: General Surgery

## 2017-10-15 LAB — CBC WITH DIFFERENTIAL/PLATELET
Basophils Absolute: 0 10*3/uL (ref 0.0–0.1)
Basophils Relative: 0 %
EOS ABS: 0 10*3/uL (ref 0.0–0.7)
EOS PCT: 0 %
HCT: 40.2 % (ref 39.0–52.0)
HEMOGLOBIN: 12.8 g/dL — AB (ref 13.0–17.0)
Lymphocytes Relative: 11 %
Lymphs Abs: 0.9 10*3/uL (ref 0.7–4.0)
MCH: 26.3 pg (ref 26.0–34.0)
MCHC: 31.8 g/dL (ref 30.0–36.0)
MCV: 82.7 fL (ref 78.0–100.0)
MONOS PCT: 6 %
Monocytes Absolute: 0.5 10*3/uL (ref 0.1–1.0)
NEUTROS PCT: 83 %
Neutro Abs: 7.3 10*3/uL (ref 1.7–7.7)
PLATELETS: 149 10*3/uL — AB (ref 150–400)
RBC: 4.86 MIL/uL (ref 4.22–5.81)
RDW: 15.6 % — ABNORMAL HIGH (ref 11.5–15.5)
WBC: 8.8 10*3/uL (ref 4.0–10.5)

## 2017-10-15 NOTE — Progress Notes (Signed)
Pt alert and oriented, tolerating diet. Discharge instructions gone over with pt, questions answered, and d/c'd home with wife.

## 2017-10-15 NOTE — Progress Notes (Signed)
Patient alert and oriented, pain is controlled. Patient is tolerating fluids, tolerating small amts of protein shake.   Reviewed Gastric sleeve discharge instructions with patient and patient is able to articulate understanding.  Provided information on BELT program, Support Group and [facility] outpatient pharmacy. All questions answered, will continue to monitor.  Pt ready for discharge

## 2017-10-15 NOTE — Discharge Summary (Signed)
Patient ID: Anthony Carlson [id number] 56 y.o. July 25, 1960  10/14/2017  Discharge date and time: 10/15/2017   Admitting Physician: Mariella Saa  Discharge Physician: Mariella Saa  Admission Diagnoses: Morbid Obesity, HLD, HTN, GERD, OSA  Discharge Diagnoses: Same  Operations: Procedure(s): LAPAROSCOPIC GASTRIC SLEEVE RESECTION, UPPER ENDOSCOPY  Admission Condition: good  Discharged Condition: good  Indication for Admission: Patient with progressive morbid obesity unresponsive to multiple efforts at medical management who presents with a BMI of 48 and comorbidities of severe degenerative joint disease, obstructive sleep apnea, hypertension and dyslipidemia.  After extensive preoperative workup and discussion detailed elsewhere we have elected to proceed with laparoscopic sleeve gastrectomy for treatment of his morbid obesity.    Hospital Course: On the morning of admission the patient underwent an uneventful laparoscopic sleeve gastrectomy.  His postoperative course was uncomplicated.  On the following morning he has no significant pain.  Tolerating fluids without nausea.  He has been up walking.  Abdomen is soft and nontender.  Incisions clean.  CBC unremarkable with normal white count and hemoglobin.  He is felt ready for discharge.   Disposition: Home  Patient Instructions:  Allergies as of 10/15/2017      Reactions   Avelox [moxifloxacin Hcl In Nacl] Anaphylaxis   Pyridostigmine Diarrhea, Nausea Only   Hydromorphone Other (See Comments)   Agitation       Medication List    TAKE these medications   acetaminophen 500 MG tablet Commonly known as:  TYLENOL Take 1,000 mg by mouth 2 (two) times daily as needed for moderate pain or headache.   atorvastatin 40 MG tablet Commonly known as:  LIPITOR Take 40 mg by mouth at bedtime.   benzoyl peroxide 10 % gel Apply 1 application topically daily as needed (acne).   clindamycin 1 % external solution Commonly  known as:  CLEOCIN T Apply 1 application topically daily as needed (acne).   DEPO-TESTOSTERONE 200 MG/ML injection Generic drug:  testosterone cypionate Inject 200 mg into the muscle every 14 (fourteen) days.   diclofenac 75 MG EC tablet Commonly known as:  VOLTAREN Take 75 mg by mouth 2 (two) times daily.   diclofenac sodium 1 % Gel Commonly known as:  VOLTAREN Apply 1 application topically 4 (four) times daily as needed for pain.   doxazosin 8 MG tablet Commonly known as:  CARDURA Take 8 mg by mouth daily.   DYMISTA 137-50 MCG/ACT Susp Generic drug:  Azelastine-Fluticasone Place 2 sprays into the nose 2 (two) times daily as needed (allergies).   gabapentin 600 MG tablet Commonly known as:  NEURONTIN Take 600 mg by mouth 3 (three) times daily.   hyoscyamine 0.125 MG SL tablet Commonly known as:  LEVSIN SL Place 0.125 mg under the tongue 3 (three) times daily as needed (bladder spasms).   lisinopril-hydrochlorothiazide 20-25 MG tablet Commonly known as:  PRINZIDE,ZESTORETIC Take 1 tablet by mouth daily. Notes to patient:  Monitor Blood Pressure Daily and keep a log for primary care physician.  You may need to make changes to your medications with rapid weight loss.     metoprolol tartrate 50 MG tablet Commonly known as:  LOPRESSOR Take 50 mg by mouth 2 (two) times daily. Notes to patient:  Monitor Blood Pressure Daily and keep a log for primary care physician.  You may need to make changes to your medications with rapid weight loss.     modafinil 200 MG tablet Commonly known as:  PROVIGIL Take 200 mg by mouth daily.  multivitamin with minerals Tabs tablet Take 1 tablet by mouth daily.   mycophenolate 500 MG tablet Commonly known as:  CELLCEPT Take 500 mg by mouth 2 (two) times daily.   omeprazole 40 MG capsule Commonly known as:  PRILOSEC Take 40 mg by mouth 2 (two) times daily.   sildenafil 20 MG tablet Commonly known as:  REVATIO Take 60-100 mg by mouth  daily as needed (erectile dysfunction).   SUPER B COMPLEX/C Caps Take 1 capsule by mouth daily.   tadalafil 20 MG tablet Commonly known as:  CIALIS Take 20 mg by mouth daily as needed for erectile dysfunction.   ULORIC 40 MG tablet Generic drug:  febuxostat Take 40 mg by mouth daily.   Vitamin D3 5000 units Caps Take 5,000 Units by mouth daily.       Activity: activity as tolerated Diet: Bariatric protein shakes Wound Care: none needed  Follow-up:  With Anthony Carlson in 3 weeks.  Signed: Mariella SaaBenjamin T Octavian Godek MD, FACS  10/15/2017, 8:46 AM

## 2017-10-15 NOTE — Progress Notes (Signed)
Patient alert and oriented, Post op day 1.  Provided support and encouragement.  Encouraged pulmonary toilet, ambulation and small sips of liquids.  Tolerating protein shake in small amts w/o difficulty.  All questions answered.  Dr. Johna SheriffHoxworth in to evaluate pt.  Pt to be discharged this am.

## 2017-10-15 NOTE — Plan of Care (Signed)
Nutrition Education Note  Received consult for diet education per DROP protocol.   Discussed 2 week post op diet with pt. Emphasized that liquids must be non carbonated, non caffeinated, and sugar free. Fluid goals discussed. Pt to follow up with outpatient bariatric RD for further diet progression after 2 weeks. Multivitamins and minerals also reviewed. Teach back method used, pt expressed understanding, expect good compliance.   Diet: First 2 Weeks  You will see the nutritionist about two (2) weeks after your surgery. The nutritionist will increase the types of foods you can eat if you are handling liquids well:  If you have severe vomiting or nausea and cannot handle clear liquids lasting longer than 1 day, call your surgeon  Protein Shake  Drink at least 2 ounces of shake 5-6 times per day  Each serving of protein shakes (usually 8 - 12 ounces) should have a minimum of:  15 grams of protein  And no more than 5 grams of carbohydrate  Goal for protein each day:  Men = 80 grams per day  Women = 60 grams per day  Protein powder may be added to fluids such as non-fat milk or Lactaid milk or Soy milk (limit to 35 grams added protein powder per serving)   Hydration  Slowly increase the amount of water and other clear liquids as tolerated (See Acceptable Fluids)  Slowly increase the amount of protein shake as tolerated  Sip fluids slowly and throughout the day  May use sugar substitutes in small amounts (no more than 6 - 8 packets per day; i.e. Splenda)   Fluid Goal  The first goal is to drink at least 8 ounces of protein shake/drink per day (or as directed by the nutritionist); some examples of protein shakes are Premier Protein, Syntrax Nectar, Adkins Advantage, EAS Edge HP, and Unjury. See handout from pre-op Bariatric Education Class:  Slowly increase the amount of protein shake you drink as tolerated  You may find it easier to slowly sip shakes throughout the day  It is important to  get your proteins in first  Your fluid goal is to drink 64 - 100 ounces of fluid daily  It may take a few weeks to build up to this  32 oz (or more) should be clear liquids  And  32 oz (or more) should be full liquids (see below for examples)  Liquids should not contain sugar, caffeine, or carbonation   Clear Liquids:  Water or Sugar-free flavored water (i.e. Fruit H2O, Propel)  Decaffeinated coffee or tea (sugar-free)  Crystal Lite, Wyler?s Lite, Minute Maid Lite  Sugar-free Jell-O  Bouillon or broth  Sugar-free Popsicle: *Less than 20 calories each; Limit 1 per day   Full Liquids:  Protein Shakes/Drinks + 2 choices per day of other full liquids  Full liquids must be:  No More Than 12 grams of Carbs per serving  No More Than 3 grams of Fat per serving  Strained low-fat cream soup  Non-Fat milk  Fat-free Lactaid Milk  Sugar-free yogurt (Dannon Lite & Fit, Greek yogurt, Oikos Zero)   Shirla Hodgkiss RD, LDN Clinical Nutrition Pager # - [phone]   

## 2017-10-20 ENCOUNTER — Emergency Department (HOSPITAL_COMMUNITY): Payer: BLUE CROSS/BLUE SHIELD

## 2017-10-20 ENCOUNTER — Encounter (HOSPITAL_COMMUNITY): Payer: Self-pay | Admitting: Emergency Medicine

## 2017-10-20 ENCOUNTER — Other Ambulatory Visit: Payer: Self-pay

## 2017-10-20 ENCOUNTER — Observation Stay (HOSPITAL_COMMUNITY)
Admission: EM | Admit: 2017-10-20 | Discharge: 2017-10-21 | Disposition: A | Discharge status: Home / Self Care | Payer: BLUE CROSS/BLUE SHIELD | Attending: Internal Medicine | Admitting: Internal Medicine

## 2017-10-20 DIAGNOSIS — G7000 Myasthenia gravis without (acute) exacerbation: Secondary | ICD-10-CM | POA: Insufficient documentation

## 2017-10-20 DIAGNOSIS — M109 Gout, unspecified: Secondary | ICD-10-CM | POA: Insufficient documentation

## 2017-10-20 DIAGNOSIS — E7800 Pure hypercholesterolemia, unspecified: Secondary | ICD-10-CM

## 2017-10-20 DIAGNOSIS — N400 Enlarged prostate without lower urinary tract symptoms: Secondary | ICD-10-CM | POA: Diagnosis present

## 2017-10-20 DIAGNOSIS — Z87891 Personal history of nicotine dependence: Secondary | ICD-10-CM | POA: Insufficient documentation

## 2017-10-20 DIAGNOSIS — E785 Hyperlipidemia, unspecified: Secondary | ICD-10-CM | POA: Diagnosis not present

## 2017-10-20 DIAGNOSIS — I10 Essential (primary) hypertension: Secondary | ICD-10-CM | POA: Diagnosis not present

## 2017-10-20 DIAGNOSIS — R55 Syncope and collapse: Secondary | ICD-10-CM

## 2017-10-20 DIAGNOSIS — I6523 Occlusion and stenosis of bilateral carotid arteries: Principal | ICD-10-CM | POA: Insufficient documentation

## 2017-10-20 DIAGNOSIS — F329 Major depressive disorder, single episode, unspecified: Secondary | ICD-10-CM | POA: Insufficient documentation

## 2017-10-20 DIAGNOSIS — Z6841 Body Mass Index (BMI) 40.0 and over, adult: Secondary | ICD-10-CM | POA: Diagnosis not present

## 2017-10-20 DIAGNOSIS — Z9884 Bariatric surgery status: Secondary | ICD-10-CM | POA: Insufficient documentation

## 2017-10-20 DIAGNOSIS — Z79899 Other long term (current) drug therapy: Secondary | ICD-10-CM | POA: Diagnosis not present

## 2017-10-20 DIAGNOSIS — Z885 Allergy status to narcotic agent status: Secondary | ICD-10-CM | POA: Insufficient documentation

## 2017-10-20 DIAGNOSIS — K219 Gastro-esophageal reflux disease without esophagitis: Secondary | ICD-10-CM | POA: Insufficient documentation

## 2017-10-20 DIAGNOSIS — G4733 Obstructive sleep apnea (adult) (pediatric): Secondary | ICD-10-CM | POA: Diagnosis not present

## 2017-10-20 DIAGNOSIS — N179 Acute kidney failure, unspecified: Secondary | ICD-10-CM | POA: Diagnosis not present

## 2017-10-20 DIAGNOSIS — R52 Pain, unspecified: Secondary | ICD-10-CM

## 2017-10-20 DIAGNOSIS — K589 Irritable bowel syndrome without diarrhea: Secondary | ICD-10-CM | POA: Insufficient documentation

## 2017-10-20 HISTORY — DX: Obstructive sleep apnea (adult) (pediatric): G47.33

## 2017-10-20 HISTORY — DX: Dependence on other enabling machines and devices: Z99.89

## 2017-10-20 HISTORY — DX: Gout, unspecified: M10.9

## 2017-10-20 LAB — CBC WITH DIFFERENTIAL/PLATELET
Basophils Absolute: 0 10*3/uL (ref 0.0–0.1)
Basophils Relative: 0 %
Eosinophils Absolute: 0.2 10*3/uL (ref 0.0–0.7)
Eosinophils Relative: 2 %
HEMATOCRIT: 41.6 % (ref 39.0–52.0)
Hemoglobin: 13.1 g/dL (ref 13.0–17.0)
LYMPHS ABS: 1 10*3/uL (ref 0.7–4.0)
LYMPHS PCT: 9 %
MCH: 26.2 pg (ref 26.0–34.0)
MCHC: 31.5 g/dL (ref 30.0–36.0)
MCV: 83.2 fL (ref 78.0–100.0)
MONO ABS: 0.8 10*3/uL (ref 0.1–1.0)
MONOS PCT: 7 %
NEUTROS ABS: 8.7 10*3/uL — AB (ref 1.7–7.7)
Neutrophils Relative %: 82 %
Platelets: 151 10*3/uL (ref 150–400)
RBC: 5 MIL/uL (ref 4.22–5.81)
RDW: 15.5 % (ref 11.5–15.5)
WBC: 10.7 10*3/uL — ABNORMAL HIGH (ref 4.0–10.5)

## 2017-10-20 LAB — TSH: TSH: 0.394 u[IU]/mL (ref 0.350–4.500)

## 2017-10-20 LAB — COMPREHENSIVE METABOLIC PANEL
ALK PHOS: 73 U/L (ref 38–126)
ALT: 45 U/L (ref 17–63)
ANION GAP: 9 (ref 5–15)
AST: 21 U/L (ref 15–41)
Albumin: 3.6 g/dL (ref 3.5–5.0)
BILIRUBIN TOTAL: 0.7 mg/dL (ref 0.3–1.2)
BUN: 46 mg/dL — ABNORMAL HIGH (ref 6–20)
CALCIUM: 9 mg/dL (ref 8.9–10.3)
CO2: 24 mmol/L (ref 22–32)
Chloride: 108 mmol/L (ref 101–111)
Creatinine, Ser: 1.59 mg/dL — ABNORMAL HIGH (ref 0.61–1.24)
GFR calc Af Amer: 54 mL/min — ABNORMAL LOW (ref >60)
GFR, EST NON AFRICAN AMERICAN: 47 mL/min — AB (ref >60)
GLUCOSE: 102 mg/dL — AB (ref 65–99)
POTASSIUM: 3.8 mmol/L (ref 3.5–5.1)
Sodium: 141 mmol/L (ref 135–145)
TOTAL PROTEIN: 6.5 g/dL (ref 6.5–8.1)

## 2017-10-20 LAB — I-STAT TROPONIN, ED: TROPONIN I, POC: 0 ng/mL (ref 0.00–0.08)

## 2017-10-20 LAB — TROPONIN I

## 2017-10-20 MED ORDER — PREMIER PROTEIN SHAKE
11.0000 [oz_av] | Freq: Four times a day (QID) | ORAL | Status: DC
  Administered 2017-10-21 (×2): 11 [oz_av] via ORAL
  Filled 2017-10-20 (×5): qty 325.31

## 2017-10-20 MED ORDER — CLINDAMYCIN PHOSPHATE 1 % EX SOLN: 1.0000 "application " | Freq: Every day | CUTANEOUS | Status: DC | PRN

## 2017-10-20 MED ORDER — DICLOFENAC SODIUM 1 % TD GEL
2.0000 g | Freq: Four times a day (QID) | TRANSDERMAL | Status: DC | PRN
  Administered 2017-10-21: 2 g via TOPICAL
  Filled 2017-10-20: qty 100

## 2017-10-20 MED ORDER — GABAPENTIN 300 MG PO CAPS
600.0000 mg | ORAL_CAPSULE | Freq: Three times a day (TID) | ORAL | Status: DC
  Administered 2017-10-20 – 2017-10-21 (×3): 600 mg via ORAL
  Filled 2017-10-20 (×3): qty 2

## 2017-10-20 MED ORDER — ATORVASTATIN CALCIUM 40 MG PO TABS
40.0000 mg | ORAL_TABLET | Freq: Every day | ORAL | Status: DC
  Administered 2017-10-20: 40 mg via ORAL
  Filled 2017-10-20: qty 1

## 2017-10-20 MED ORDER — ENOXAPARIN SODIUM 40 MG/0.4ML ~~LOC~~ SOLN
40.0000 mg | SUBCUTANEOUS | Status: DC
  Administered 2017-10-20: 40 mg via SUBCUTANEOUS
  Filled 2017-10-20: qty 0.4

## 2017-10-20 MED ORDER — GABAPENTIN 600 MG PO TABS
600.0000 mg | ORAL_TABLET | Freq: Three times a day (TID) | ORAL | Status: DC
  Filled 2017-10-20: qty 1

## 2017-10-20 MED ORDER — HYDROCHLOROTHIAZIDE 12.5 MG PO CAPS
12.5000 mg | ORAL_CAPSULE | Freq: Every day | ORAL | Status: DC
  Administered 2017-10-21: 12.5 mg via ORAL
  Filled 2017-10-20: qty 1

## 2017-10-20 MED ORDER — FEBUXOSTAT 40 MG PO TABS
40.0000 mg | ORAL_TABLET | Freq: Every day | ORAL | Status: DC
  Administered 2017-10-21: 40 mg via ORAL
  Filled 2017-10-20: qty 1

## 2017-10-20 MED ORDER — LISINOPRIL 10 MG PO TABS
10.0000 mg | ORAL_TABLET | Freq: Every day | ORAL | Status: DC
  Administered 2017-10-21: 10 mg via ORAL
  Filled 2017-10-20: qty 1

## 2017-10-20 MED ORDER — SODIUM CHLORIDE 0.9 % IV SOLN
INTRAVENOUS | Status: AC
  Administered 2017-10-20: 21:00:00 via INTRAVENOUS

## 2017-10-20 MED ORDER — MODAFINIL 200 MG PO TABS
200.0000 mg | ORAL_TABLET | Freq: Every day | ORAL | Status: DC
  Administered 2017-10-21: 200 mg via ORAL
  Filled 2017-10-20 (×2): qty 1

## 2017-10-20 MED ORDER — VITAMIN D3 25 MCG (1000 UNIT) PO TABS
5000.0000 [IU] | ORAL_TABLET | Freq: Every day | ORAL | Status: DC
  Administered 2017-10-21: 5000 [IU] via ORAL
  Filled 2017-10-20: qty 5

## 2017-10-20 MED ORDER — METOPROLOL TARTRATE 50 MG PO TABS
50.0000 mg | ORAL_TABLET | Freq: Two times a day (BID) | ORAL | Status: DC
  Administered 2017-10-20 – 2017-10-21 (×2): 50 mg via ORAL
  Filled 2017-10-20 (×2): qty 1

## 2017-10-20 MED ORDER — SODIUM CHLORIDE 0.9 % IV BOLUS
1000.0000 mL | Freq: Once | INTRAVENOUS | Status: AC
  Administered 2017-10-20: 1000 mL via INTRAVENOUS

## 2017-10-20 MED ORDER — PANTOPRAZOLE SODIUM 40 MG PO TBEC
40.0000 mg | DELAYED_RELEASE_TABLET | Freq: Every day | ORAL | Status: DC
  Administered 2017-10-21: 40 mg via ORAL
  Filled 2017-10-20: qty 1

## 2017-10-20 MED ORDER — MYCOPHENOLATE MOFETIL 250 MG PO CAPS
1000.0000 mg | ORAL_CAPSULE | Freq: Two times a day (BID) | ORAL | Status: DC
  Administered 2017-10-20 – 2017-10-21 (×2): 1000 mg via ORAL
  Filled 2017-10-20 (×3): qty 4

## 2017-10-20 MED ORDER — ACETAMINOPHEN 325 MG PO TABS: 650.0000 mg | ORAL_TABLET | Freq: Four times a day (QID) | ORAL | Status: DC | PRN

## 2017-10-20 MED ORDER — BENZOYL PEROXIDE 10 % EX GEL: 1.0000 "application " | Freq: Every day | CUTANEOUS | Status: DC | PRN

## 2017-10-20 MED ORDER — ADULT MULTIVITAMIN W/MINERALS CH
1.0000 | ORAL_TABLET | Freq: Every day | ORAL | Status: DC
  Administered 2017-10-21: 1 via ORAL
  Filled 2017-10-20: qty 1

## 2017-10-20 MED ORDER — HYOSCYAMINE SULFATE 0.125 MG SL SUBL
0.1250 mg | SUBLINGUAL_TABLET | Freq: Three times a day (TID) | SUBLINGUAL | Status: DC | PRN
  Filled 2017-10-20: qty 1

## 2017-10-20 MED ORDER — ACETAMINOPHEN 650 MG RE SUPP: 650.0000 mg | Freq: Four times a day (QID) | RECTAL | Status: DC | PRN

## 2017-10-20 MED ORDER — AZELASTINE-FLUTICASONE 137-50 MCG/ACT NA SUSP: 2.0000 | Freq: Two times a day (BID) | NASAL | Status: DC | PRN

## 2017-10-20 NOTE — ED Triage Notes (Signed)
Per EMS, patient from home, c/o syncopal episode today with fall. Gastric sleeve surgery last week. Hx HTN, reports taking multiple hypertensive medications.   BP 80/40  20g L FA NS with EMS

## 2017-10-20 NOTE — H&P (Signed)
[facility] H&P   Patient Demographics:    Anthony Carlson, is a 57 y.o. male  MRN: [id number]   DOB - 12/19/1960  Admit Date - 10/20/2017  Outpatient Primary MD for the patient is Dema Severin, NP  Referring MD/NP/PA:  Dorthula Perfect  Outpatient Specialists:   [facility]  Patient coming from: home  Chief Complaint  Patient presents with  . Loss of Consciousness      HPI:    Anthony Carlson  is a 57 y.o. male, w Myasthenia Gravis, Gerd, IBS, OSA on cpap, Hypertension, Morbid obeisity s/p gastric sleeve (Monday)  apparently c/o syncope x3.  Was brushing teeth the first time and the other 2x were in the shower.  The last 2 were witnessed by his wife.  He was sitting in the shower seat when he passed out.  Out for just a few seconds.  Felt slightly nauseated and dizziness prior to syncope.  Denies cp, palp, emesis, diarrhea, incontinence, focal weakness, numbness, tingling.  Pt notes that his bp medication has not been adjusted even though he has lost significant weight.   In Ed,  CT brain IMPRESSION: No acute findings.  Chest/ Abd xray IMPRESSION: Nonobstructive bowel gas pattern. No acute cardiopulmonary disease.  Wbc 10.7, Hgb 13.1, Plt 151 Na 141, K 3.8, Bun 46, Creatinine 1.59 Ast 21, Alt 45 Trop 0.00  Pt will be admitted for w/up of syncope     Review of systems:    In addition to the HPI above,    No Fever-chills, No Headache, No changes with Vision or hearing, No problems swallowing food or Liquids, No Chest pain, Cough or Shortness of Breath, No Abdominal pain, No Nausea or Vommitting, Bowel movements are regular, No Blood in stool or Urine, No dysuria, No new skin rashes or bruises, No new joints pains-aches,  No new weakness, tingling, numbness in any extremity, No recent weight gain or loss, No polyuria, polydypsia or polyphagia, No significant  Mental Stressors.  A full 10 point Review of Systems was done, except as stated above, all other Review of Systems were negative.   With Past History of the following :    Past Medical History:  Diagnosis Date  . Arthritis   . Chronic fatigue   . Depression   . GERD (gastroesophageal reflux disease)   . Hypertension   . IBS (irritable bowel syndrome)   . Memory changes   . Mental disorder   . Myasthenia gravis (HCC) 02/2015  . OSA on CPAP       Past Surgical History:  Procedure Laterality Date  . ankle fusions     x 2   . CARPAL TUNNEL RELEASE Left    2018  . LAPAROSCOPIC GASTRIC SLEEVE RESECTION N/A 10/14/2017   Procedure: LAPAROSCOPIC GASTRIC SLEEVE RESECTION, UPPER ENDOSCOPY;  Surgeon: Glenna Fellows, MD;  Location: [facility];  Service: General;  Laterality: N/A;  . THULIUM LASER TURP (TRANSURETHRAL RESECTION OF PROSTATE)  11/02/2016  . vasectomy and reversal        Social History:     Social History   Tobacco Use  . Smoking status: Former Smoker    Packs/day: 2.00    Years: 14.00    Pack years: 28.00    Last attempt to quit: 1988    Years since quitting: 31.3  . Smokeless tobacco: Never Used  Substance Use Topics  . 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 Alcoholism Mother   . Drug abuse Sister   . Drug abuse Sister   . Severe combined immunodeficiency Maternal Uncle   . Suicidality Maternal Uncle   . COPD Other   . Diabetes Other   . Cancer Other   . Hypertension Other        Home Medications:   Prior to Admission medications   Medication Sig Start Date End Date Taking? Authorizing Provider  acetaminophen (TYLENOL) 500 MG tablet Take 1,000 mg by mouth 2 (two) times daily as needed for moderate pain or headache.    Yes [provider]  atorvastatin (LIPITOR) 40 MG  tablet Take 40 mg by mouth at bedtime.  03/13/16  Yes [provider]  Cholecalciferol (VITAMIN D3) 5000 units CAPS Take 1 capsule by mouth daily.   Yes [provider]  diclofenac (VOLTAREN) 75 MG EC tablet Take 75 mg by mouth 2 (two) times daily.   Yes [provider]  diclofenac sodium (VOLTAREN) 1 % GEL Apply 1 application topically 4 (four) times daily as needed for pain. 09/24/17  Yes [provider]  doxazosin (CARDURA) 8 MG tablet Take 8 mg by mouth daily.   Yes [provider]  gabapentin (NEURONTIN) 600 MG tablet Take 600 mg by mouth 3 (three) times daily.   Yes [provider]  hyoscyamine (LEVSIN SL) 0.125 MG SL tablet Place 0.125 mg under the tongue 3 (three) times daily as needed (bladder spasms and GI).    Yes [provider]  lisinopril-hydrochlorothiazide (PRINZIDE,ZESTORETIC) 20-25 MG tablet Take 1 tablet by mouth daily.   Yes [provider]  metoprolol (LOPRESSOR) 50 MG tablet Take 50 mg by mouth 2 (two) times daily.  03/08/16  Yes [provider]  modafinil (PROVIGIL) 200 MG tablet Take 200 mg by mouth daily.   Yes [provider]  Multiple Vitamin (MULTIVITAMIN WITH MINERALS) TABS tablet Take 1 tablet by mouth daily.   Yes [provider]  mycophenolate (CELLCEPT) 500 MG tablet Take 1,000 mg by mouth 2 (two) times daily.    Yes [provider]  omeprazole (PRILOSEC) 40 MG capsule Take 40 mg by mouth 2 (two) times daily.   Yes [provider]  testosterone cypionate (DEPO-TESTOSTERONE) 200 MG/ML injection Inject 200 mg into the muscle every 14 (fourteen) days.    Yes [provider]  ULORIC 40 MG tablet Take 40 mg by mouth daily. 08/08/17  Yes [provider]  Azelastine-Fluticasone (DYMISTA) 137-50 MCG/ACT SUSP Place 2 sprays into the nose 2 (two) times daily as needed (allergies).    [provider]  benzoyl peroxide 10 % gel Apply 1  application topically daily as needed (acne).  [provider]  clindamycin (CLEOCIN T) 1 % external solution Apply 1 application topically daily as needed (acne).  09/23/17   [provider]  sildenafil (REVATIO) 20 MG tablet Take 60-100 mg by mouth daily as needed (erectile dysfunction).  09/04/17   [provider]  tadalafil (CIALIS) 20 MG tablet Take 20 mg by mouth daily as needed for erectile dysfunction.    [provider]     Allergies:     Allergies  Allergen Reactions  . Avelox [Moxifloxacin Hcl In Nacl] Anaphylaxis  . Pyridostigmine Diarrhea and Nausea Only  . Hydromorphone Other (See Comments)    Agitation      Physical Exam:   Vitals  Blood pressure 103/84, pulse 76, temperature 98.3 F (36.8 C), temperature source Oral, resp. rate 18, SpO2 99 %.   1. General  lying in bed in NAD,    2. Normal affect and insight, Not Suicidal or Homicidal, Awake Alert, Oriented X 3.  3. No F.N deficits, ALL C.Nerves Intact, Strength 5/5 all 4 extremities, Sensation intact all 4 extremities, Plantars down going.  4. Ears and Eyes appear Normal, Conjunctivae clear, PERRLA. Moist Oral Mucosa.  5. Supple Neck, No JVD, No cervical lymphadenopathy appriciated, No Carotid Bruits.  6. Symmetrical Chest wall movement, Good air movement bilaterally, CTAB.  7. RRR, No Gallops, Rubs or Murmurs, No Parasternal Heave.  8. Positive Bowel Sounds, Abdomen Soft, No tenderness, No organomegaly appriciated,No rebound -guarding or rigidity.  9.  No Cyanosis, Normal Skin Turgor, No Skin Rash or Bruise.  10. Good muscle tone,  joints appear normal , no effusions, Normal ROM.  11. No Palpable Lymph Nodes in Neck or Axillae      Data Review:    CBC Recent Labs  Lab 10/14/17 1556 10/15/17 0451 10/20/17 1549  WBC 12.5* 8.8 10.7*  HGB 13.9 12.8* 13.1  HCT 43.9 40.2 41.6  PLT 151 149* 151  MCV 82.8 82.7 83.2  MCH 26.2 26.3 26.2  MCHC 31.7 31.8 31.5    RDW 15.5 15.6* 15.5  LYMPHSABS  --  0.9 1.0  MONOABS  --  0.5 0.8  EOSABS  --  0.0 0.2  BASOSABS  --  0.0 0.0   ------------------------------------------------------------------------------------------------------------------  Chemistries  Recent Labs  Lab 10/14/17 1556 10/20/17 1549  NA  --  141  K  --  3.8  CL  --  108  CO2  --  24  GLUCOSE  --  102*  BUN  --  46*  CREATININE 1.14 1.59*  CALCIUM  --  9.0  AST  --  21  ALT  --  45  ALKPHOS  --  73  BILITOT  --  0.7   ------------------------------------------------------------------------------------------------------------------ estimated creatinine clearance is 66 mL/min (A) (by C-G formula based on SCr of 1.59 mg/dL (H)). ------------------------------------------------------------------------------------------------------------------ No results for input(s): TSH, T4TOTAL, T3FREE, THYROIDAB in the last 72 hours.  Invalid input(s): FREET3  Coagulation profile No results for input(s): INR, PROTIME in the last 168 hours. ------------------------------------------------------------------------------------------------------------------- No results for input(s): DDIMER in the last 72 hours. -------------------------------------------------------------------------------------------------------------------  Cardiac Enzymes No results for input(s): CKMB, TROPONINI, MYOGLOBIN in the last 168 hours.  Invalid input(s): CK ------------------------------------------------------------------------------------------------------------------ No results found for: BNP   ---------------------------------------------------------------------------------------------------------------  Urinalysis    Component Value Date/Time   COLORURINE AMBER (A) 04/14/2016 0747   APPEARANCEUR CLEAR 04/14/2016 0747   LABSPEC 1.017 04/14/2016 0747   PHURINE 6.5 04/14/2016 0747   GLUCOSEU NEGATIVE 04/14/2016 0747   HGBUR NEGATIVE 04/14/2016  0747    BILIRUBINUR NEGATIVE 04/14/2016 0747   KETONESUR NEGATIVE 04/14/2016 0747   PROTEINUR NEGATIVE 04/14/2016 0747   UROBILINOGEN 0.2 01/25/2010 1547   NITRITE NEGATIVE 04/14/2016 0747   LEUKOCYTESUR NEGATIVE 04/14/2016 0747    ----------------------------------------------------------------------------------------------------------------   Imaging Results:    Ct Head Wo Contrast  Result Date: 10/20/2017 CLINICAL DATA:  Syncopal episode today with fall. EXAM: CT HEAD WITHOUT CONTRAST TECHNIQUE: Contiguous axial images were obtained from the base of the skull through the vertex without intravenous contrast. COMPARISON:  MRI brain 03/10/2016 FINDINGS: Brain: Ventricles, cisterns and other CSF spaces are within normal. There is no mass, mass effect, shift of midline structures or acute hemorrhage. No evidence of acute infarction. Vascular: No hyperdense vessel or unexpected calcification. Skull: Normal. Negative for fracture or focal lesion. Sinuses/Orbits: Orbits are normal. Paranasal sinuses are well developed with minimal mucosal membrane thickening involving the right maxillary sinus. Mastoid air cells are clear. Other: None. IMPRESSION: No acute findings. Electronically Signed   By: Elberta Fortis M.D.   On: 10/20/2017 16:47   Dg Abd Acute W/chest  Result Date: 10/20/2017 CLINICAL DATA:  Syncope with fall today. Gastric sleeve surgery last week. EXAM: DG ABDOMEN ACUTE W/ 1V CHEST COMPARISON:  Chest x-ray 01/25/2010 FINDINGS: Lungs are adequately inflated and otherwise clear. Cardiomediastinal silhouette is within normal. Remainder of the chest is unchanged. Abdominopelvic images demonstrate a nonobstructive bowel gas pattern with mild fecal retention throughout the colon. No mass or mass effect. No free peritoneal air. Mild degenerate change of the spine. Left pelvic phlebolith. IMPRESSION: Nonobstructive bowel gas pattern. No acute cardiopulmonary disease. Electronically Signed   By: Elberta Fortis  M.D.   On: 10/20/2017 16:36    nsr at 70, nl axis, nl int, slight t flattening in v5,6   Assessment & Plan:    Principal Problem:   Syncope Active Problems:   Morbid obesity (HCC)    Syncope Tele Trop I q6h x3 Check orthostatic bp Hydrate with ns due to lower end bp Check carotid ultrasound Check cardiac echo  Mild ARF STOP Diclofenac Hydrate with ns iv Decrease Lisinopril /hydrochlorothiazide to 10/12.5mg  po qday Check cmp in am  Osa on Cpap Cont Cpap at home setting  Morbid obeisity BMI 44.36 S/p gastric sleeve Bariatric diet  Hypertension Due to lower bp STOP doxazosin Decrease Lisinopril/hydrochlorothiazide as above Cont metoprolol  Bph STOP doxazosin, pt thinks doesn't need since had TURP  Hyperlipidemia Cont lipitor  po qhs   Arthritis Chronic back pain Cont Gabapentin, if renal function not improving may need to reduce dose Pt requesting celebrex, if renal function improves then can start Celebrex  po qday  IBS Cont Hyoscyamine 0.125mg  po qday  DVT Prophylaxis Lovenox - SCDs    AM Labs Ordered, also please review Full Orders  Family Communication: Admission, patients condition and plan of care including tests being ordered have been discussed with the patient who indicate understanding and agree with the plan and Code Status.  Code Status FULL CODE  Likely DC to  home  Condition GUARDED    Consults called: none  Admission status:  observation   Time spent in minutes : 45   Pearson Grippe M.D on 10/20/2017 at 7:34 PM  Between 7am to 7pm - Pager - [phone]  . After 7pm go to [email or web address] - password [email or web address]  [facility] - Office  [phone]

## 2017-10-20 NOTE — ED Notes (Signed)
ED Provider at bedside. 

## 2017-10-20 NOTE — ED Notes (Signed)
ED TO INPATIENT HANDOFF REPORT  Name/Age/Gender Anthony Carlson 57 y.o. male  Code Status    Code Status Orders  (From admission, onward)        Start     Ordered   10/20/17 1954  Full code  Continuous     10/20/17 1956    Code Status History    Date Active Date Inactive Code Status Order ID Comments User Context   10/14/2017 1535 10/15/2017 1342 Full Code [id number]  Excell Seltzer, MD Inpatient      Home/SNF/Other Home  Chief Complaint syncope; hypotension  Level of Care/Admitting Diagnosis ED Disposition    ED Disposition Condition [facility] Area: [facility] [[id number]]  Level of Care: Telemetry [5]  Admit to tele based on following criteria: Eval of Syncope  Diagnosis: Syncope [206001]  Admitting Physician: Jani Gravel [[id number]]  Attending Physician: Jani Gravel [[id number]]  PT Class (Do Not Modify): Observation [104]  PT Acc Code (Do Not Modify): Observation [10022]       Medical History Past Medical History:  Diagnosis Date  . Arthritis   . Chronic fatigue   . Depression   . GERD (gastroesophageal reflux disease)   . Gout   . Hypertension   . IBS (irritable bowel syndrome)   . Memory changes   . Mental disorder   . Myasthenia gravis ([place]) 02/2015  . OSA on CPAP     Allergies Allergies  Allergen Reactions  . Avelox [Moxifloxacin Hcl In Nacl] Anaphylaxis  . Pyridostigmine Diarrhea and Nausea Only  . Hydromorphone Other (See Comments)    Agitation     IV Location/Drains/Wounds Patient Lines/Drains/Airways Status   Active Line/Drains/Airways    Name:   Placement date:   Placement time:   Site:   Days:   Incision (Closed) 10/14/17 Abdomen Other (Comment)   10/14/17    1314     6   Incision - 6 Ports Abdomen Right;Lateral Right;Medial Left;Umbilicus Mid;Upper Left Left;Lower   10/14/17    1216     6          Labs/Imaging Results for orders placed or performed during the hospital encounter of 10/20/17 (from the  past 48 hour(s))  CBC with Differential/Platelet     Status: Abnormal   Collection Time: 10/20/17  3:49 PM  Result Value Ref Range   WBC 10.7 (H) 4.0 - 10.5 K/uL   RBC 5.00 4.22 - 5.81 MIL/uL   Hemoglobin 13.1 13.0 - 17.0 g/dL   HCT 41.6 39.0 - 52.0 %   MCV 83.2 78.0 - 100.0 fL   MCH 26.2 26.0 - 34.0 pg   MCHC 31.5 30.0 - 36.0 g/dL   RDW 15.5 11.5 - 15.5 %   Platelets 151 150 - 400 K/uL   Neutrophils Relative % 82 %   Neutro Abs 8.7 (H) 1.7 - 7.7 K/uL   Lymphocytes Relative 9 %   Lymphs Abs 1.0 0.7 - 4.0 K/uL   Monocytes Relative 7 %   Monocytes Absolute 0.8 0.1 - 1.0 K/uL   Eosinophils Relative 2 %   Eosinophils Absolute 0.2 0.0 - 0.7 K/uL   Basophils Relative 0 %   Basophils Absolute 0.0 0.0 - 0.1 K/uL    Comment: Performed at [facility], [place] [address], [place], [place] [address]  Comprehensive metabolic panel     Status: Abnormal   Collection Time: 10/20/17  3:49 PM  Result Value Ref Range   Sodium 141  135 - 145 mmol/L   Potassium 3.8 3.5 - 5.1 mmol/L   Chloride 108 101 - 111 mmol/L   CO2 24 22 - 32 mmol/L   Glucose, Bld 102 (H) 65 - 99 mg/dL   BUN 46 (H) 6 - 20 mg/dL   Creatinine, Ser 1.59 (H) 0.61 - 1.24 mg/dL   Calcium 9.0 8.9 - 10.3 mg/dL   Total Protein 6.5 6.5 - 8.1 g/dL   Albumin 3.6 3.5 - 5.0 g/dL   AST 21 15 - 41 U/L   ALT 45 17 - 63 U/L   Alkaline Phosphatase 73 38 - 126 U/L   Total Bilirubin 0.7 0.3 - 1.2 mg/dL   GFR calc non Af Amer 47 (L) >60 mL/min   GFR calc Af Amer 54 (L) >60 mL/min    Comment: (NOTE) The eGFR has been calculated using the CKD EPI equation. This calculation has not been validated in all clinical situations. eGFR's persistently <60 mL/min signify possible Chronic Kidney Disease.    Anion gap 9 5 - 15    Comment: Performed at [facility], [place] [address] [place], [place] [address]  I-stat troponin, ED     Status: None   Collection Time: 10/20/17  4:11 PM  Result Value Ref Range   Troponin  i, poc 0.00 0.00 - 0.08 ng/mL   Comment 3            Comment: Due to the release kinetics of cTnI, a negative result within the first hours of the onset of symptoms does not rule out myocardial infarction with certainty. If myocardial infarction is still suspected, repeat the test at appropriate intervals.    Ct Head Wo Contrast  Result Date: 10/20/2017 CLINICAL DATA:  Syncopal episode today with fall. EXAM: CT HEAD WITHOUT CONTRAST TECHNIQUE: Contiguous axial images were obtained from the base of the skull through the vertex without intravenous contrast. COMPARISON:  MRI brain 03/10/2016 FINDINGS: Brain: Ventricles, cisterns and other CSF spaces are within normal. There is no mass, mass effect, shift of midline structures or acute hemorrhage. No evidence of acute infarction. Vascular: No hyperdense vessel or unexpected calcification. Skull: Normal. Negative for fracture or focal lesion. Sinuses/Orbits: Orbits are normal. Paranasal sinuses are well developed with minimal mucosal membrane thickening involving the right maxillary sinus. Mastoid air cells are clear. Other: None. IMPRESSION: No acute findings. Electronically Signed   By: Marin Olp M.D.   On: 10/20/2017 16:47   Dg Abd Acute W/chest  Result Date: 10/20/2017 CLINICAL DATA:  Syncope with fall today. Gastric sleeve surgery last week. EXAM: DG ABDOMEN ACUTE W/ 1V CHEST COMPARISON:  Chest x-ray 01/25/2010 FINDINGS: Lungs are adequately inflated and otherwise clear. Cardiomediastinal silhouette is within normal. Remainder of the chest is unchanged. Abdominopelvic images demonstrate a nonobstructive bowel gas pattern with mild fecal retention throughout the colon. No mass or mass effect. No free peritoneal air. Mild degenerate change of the spine. Left pelvic phlebolith. IMPRESSION: Nonobstructive bowel gas pattern. No acute cardiopulmonary disease. Electronically Signed   By: Marin Olp M.D.   On: 10/20/2017 16:36    Pending  Labs Unresulted Labs (From admission, onward)   Start     Ordered   10/27/17 0500  Creatinine, serum  (enoxaparin (LOVENOX)    CrCl >/= 30 ml/min)  Weekly,   R    Comments:  while on enoxaparin therapy    10/20/17 1956   10/21/17 0500  Comprehensive metabolic panel  Tomorrow morning,   R  10/20/17 1956   10/21/17 0500  CBC  Tomorrow morning,   R     10/20/17 1956   10/20/17 1956  Troponin I  Now then every 6 hours,   R     10/20/17 1956   10/20/17 1956  TSH  Once,   R     10/20/17 1956   10/20/17 1954  HIV antibody (Routine Testing)  Once,   R     10/20/17 1956   10/20/17 1954  Creatinine, serum  (enoxaparin (LOVENOX)    CrCl >/= 30 ml/min)  Once,   R    Comments:  Baseline for enoxaparin therapy IF NOT ALREADY DRAWN.    10/20/17 1956      Vitals/Pain Today's Vitals   10/20/17 1730 10/20/17 1800 10/20/17 1900 10/20/17 1930  BP: (!) 125/54 116/65 103/84 (!) 102/40  Pulse: 66 65 76 84  Resp: 17 18 18 (!) 21  Temp:      TempSrc:      SpO2: 97% 98% 99% 98%  PainSc:        Isolation Precautions No active isolations  Medications Medications  enoxaparin (LOVENOX) injection 40 mg (has no administration in time range)  0.9 %  sodium chloride infusion (has no administration in time range)  acetaminophen (TYLENOL) tablet 650 mg (has no administration in time range)    Or  acetaminophen (TYLENOL) suppository 650 mg (has no administration in time range)  sodium chloride 0.9 % bolus 1,000 mL (0 mLs Intravenous Stopped 10/20/17 1652)  sodium chloride 0.9 % bolus 1,000 mL (0 mLs Intravenous Stopped 10/20/17 1911)    Mobility walks  

## 2017-10-20 NOTE — ED Notes (Signed)
Patient transported to X-ray 

## 2017-10-20 NOTE — ED Notes (Signed)
Hospitalist at bedside 

## 2017-10-20 NOTE — ED Provider Notes (Signed)
[facility]-EMERGENCY DEPT Provider Note   CSN: [id number] Arrival date & time: 10/20/17  1510     History   Chief Complaint Chief Complaint  Patient presents with  . Loss of Consciousness    HPI RYOMA NOFZIGER is a 57 y.o. male.  Patient states that he was in the bathroom and felt little bit dizzy and passed out.  The wife that he passed out again in the bathroom and had shaking of his upper extremities and then shortly while later he was seated and passed out with her.  Patient feels fine now.  Patient had a gastric sleeve done Monday and patient is on 4 medicines for his blood pressure  The history is provided by the patient. No language interpreter was used.  Loss of Consciousness   This is a new problem. The current episode started 3 to 5 hours ago. The problem occurs rarely. The problem has been resolved. He lost consciousness for a period of less than one minute. The problem is associated with normal activity. Associated symptoms include dizziness and visual change. Pertinent negatives include abdominal pain, back pain, chest pain, congestion, headaches and seizures.    Past Medical History:  Diagnosis Date  . Arthritis   . Chronic fatigue   . Depression   . GERD (gastroesophageal reflux disease)   . Hypertension   . IBS (irritable bowel syndrome)   . Memory changes   . Mental disorder   . Myasthenia gravis ([facility]) 02/2015    Patient Active Problem List   Diagnosis Date Noted  . Syncope 10/20/2017  . Morbid obesity ([facility]) 10/14/2017  . Ptosis of eyelid 02/24/2016  . Spinal stenosis, lumbar region, with neurogenic claudication 02/24/2016    Past Surgical History:  Procedure Laterality Date  . ankle fusions     x 2   . CARPAL TUNNEL RELEASE Left    2018  . LAPAROSCOPIC GASTRIC SLEEVE RESECTION N/A 10/14/2017   Procedure: LAPAROSCOPIC GASTRIC SLEEVE RESECTION, UPPER ENDOSCOPY;  Surgeon: Glenna Fellows, MD;  Location: [facility] ORS;  Service:  General;  Laterality: N/A;  . THULIUM LASER TURP (TRANSURETHRAL RESECTION OF PROSTATE)  11/02/2016  . vasectomy and reversal          Home Medications    Prior to Admission medications   Medication Sig Start Date End Date Taking? Authorizing Provider  acetaminophen (TYLENOL) 500 MG tablet Take 1,000 mg by mouth 2 (two) times daily as needed for moderate pain or headache.    Yes [provider]  atorvastatin (LIPITOR) 40 MG tablet Take 40 mg by mouth at bedtime.  03/13/16  Yes [provider]  Cholecalciferol (VITAMIN D3) 5000 units CAPS Take 1 capsule by mouth daily.   Yes [provider]  diclofenac (VOLTAREN) 75 MG EC tablet Take 75 mg by mouth 2 (two) times daily.   Yes [provider]  diclofenac sodium (VOLTAREN) 1 % GEL Apply 1 application topically 4 (four) times daily as needed for pain. 09/24/17  Yes [provider]  doxazosin (CARDURA) 8 MG tablet Take 8 mg by mouth daily.   Yes [provider]  gabapentin (NEURONTIN) 600 MG tablet Take 600 mg by mouth 3 (three) times daily.   Yes [provider]  hyoscyamine (LEVSIN SL) 0.125 MG SL tablet Place 0.125 mg under the tongue 3 (three) times daily as needed (bladder spasms and GI).    Yes [provider]  lisinopril-hydrochlorothiazide (PRINZIDE,ZESTORETIC) 20-25 MG tablet Take 1 tablet by mouth  daily.   Yes [provider]  metoprolol (LOPRESSOR) 50 MG tablet Take 50 mg by mouth 2 (two) times daily.  03/08/16  Yes [provider]  modafinil (PROVIGIL) 200 MG tablet Take 200 mg by mouth daily.   Yes [provider]  Multiple Vitamin (MULTIVITAMIN WITH MINERALS) TABS tablet Take 1 tablet by mouth daily.   Yes [provider]  mycophenolate (CELLCEPT) 500 MG tablet Take 1,000 mg by mouth 2 (two) times daily.    Yes [provider]  omeprazole (PRILOSEC) 40 MG capsule Take 40 mg by mouth 2 (two) times daily.   Yes [provider]  testosterone cypionate (DEPO-TESTOSTERONE) 200 MG/ML injection Inject 200 mg into the muscle every 14 (fourteen) days.    Yes [provider]  ULORIC 40 MG tablet Take 40 mg by mouth daily. 08/08/17  Yes [provider]  Azelastine-Fluticasone (DYMISTA) 137-50 MCG/ACT SUSP Place 2 sprays into the nose 2 (two) times daily as needed (allergies).    [provider]  benzoyl peroxide 10 % gel Apply 1 application topically daily as needed (acne).    [provider]  clindamycin (CLEOCIN T) 1 % external solution Apply 1 application topically daily as needed (acne).  09/23/17   [provider]  sildenafil (REVATIO) 20 MG tablet Take 60-100 mg by mouth daily as needed (erectile dysfunction).  09/04/17   [provider]  tadalafil (CIALIS) 20 MG tablet Take 20 mg by mouth daily as needed for erectile dysfunction.    [provider]    Family History Family History  Problem Relation Age of Onset  . Alcoholism Mother   . Drug abuse Sister   . Drug abuse Sister   . Severe combined immunodeficiency Maternal Uncle   . Suicidality Maternal Uncle   . COPD Other   . Diabetes Other   . Cancer Other   . Hypertension Other     Social History Social History   Tobacco Use  . Smoking status: Former Smoker    Packs/day: 2.00    Years: 14.00    Pack years: 28.00    Last attempt to quit: 1988    Years since quitting: 31.3  . Smokeless tobacco: Never Used  Substance Use Topics  . Alcohol use: No    Comment: Stop drinking 1987  . 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  HENT: Negative for congestion, ear discharge and sinus pressure.   Eyes: Negative for discharge.  Respiratory: Negative for cough.     Cardiovascular: Positive for syncope. Negative for chest pain.  Gastrointestinal: Negative for abdominal pain and diarrhea.  Genitourinary: Negative for frequency and hematuria.  Musculoskeletal: Negative for back pain.  Skin: Negative for rash.  Neurological: Positive for dizziness and syncope. Negative for seizures and headaches.  Psychiatric/Behavioral: Negative for hallucinations.     Physical Exam Updated Vital Signs BP 116/65   Pulse 65   Temp 98.3 F (36.8 C) (Oral)   Resp 18   SpO2 98%   Physical Exam  Constitutional: He is oriented to person, place, and time. He appears well-developed.  HENT:  Head: Normocephalic.  Eyes: Conjunctivae and EOM are normal. No scleral icterus.  Neck: Neck supple. No thyromegaly present.  Cardiovascular: Normal rate and regular rhythm. Exam  reveals no gallop and no friction rub.  No murmur heard. Pulmonary/Chest: No stridor. He has no wheezes. He has no rales. He exhibits no tenderness.  Abdominal: He exhibits no distension. There is no tenderness. There is no rebound.  Musculoskeletal: Normal range of motion. He exhibits no edema.  Lymphadenopathy:    He has no cervical adenopathy.  Neurological: He is oriented to person, place, and time. He exhibits normal muscle tone. Coordination normal.  Skin: No rash noted. No erythema.  Psychiatric: He has a normal mood and affect. His behavior is normal.     ED Treatments / Results  Labs (all labs ordered are listed, but only abnormal results are displayed) Labs Reviewed  CBC WITH DIFFERENTIAL/PLATELET - Abnormal; Notable for the following components:      Result Value   WBC 10.7 (*)    Neutro Abs 8.7 (*)    All other components within normal limits  COMPREHENSIVE METABOLIC PANEL - Abnormal; Notable for the following components:   Glucose, Bld 102 (*)    BUN 46 (*)    Creatinine, Ser 1.59 (*)    GFR calc non Af Amer 47 (*)    GFR calc Af Amer 54 (*)    All other components within  normal limits  I-STAT TROPONIN, ED    EKG None  Radiology Ct Head Wo Contrast  Result Date: 10/20/2017 CLINICAL DATA:  Syncopal episode today with fall. EXAM: CT HEAD WITHOUT CONTRAST TECHNIQUE: Contiguous axial images were obtained from the base of the skull through the vertex without intravenous contrast. COMPARISON:  MRI brain 03/10/2016 FINDINGS: Brain: Ventricles, cisterns and other CSF spaces are within normal. There is no mass, mass effect, shift of midline structures or acute hemorrhage. No evidence of acute infarction. Vascular: No hyperdense vessel or unexpected calcification. Skull: Normal. Negative for fracture or focal lesion. Sinuses/Orbits: Orbits are normal. Paranasal sinuses are well developed with minimal mucosal membrane thickening involving the right maxillary sinus. Mastoid air cells are clear. Other: None. IMPRESSION: No acute findings. Electronically Signed   By: Elberta Fortis M.D.   On: 10/20/2017 16:47   Dg Abd Acute W/chest  Result Date: 10/20/2017 CLINICAL DATA:  Syncope with fall today. Gastric sleeve surgery last week. EXAM: DG ABDOMEN ACUTE W/ 1V CHEST COMPARISON:  Chest x-ray 01/25/2010 FINDINGS: Lungs are adequately inflated and otherwise clear. Cardiomediastinal silhouette is within normal. Remainder of the chest is unchanged. Abdominopelvic images demonstrate a nonobstructive bowel gas pattern with mild fecal retention throughout the colon. No mass or mass effect. No free peritoneal air. Mild degenerate change of the spine. Left pelvic phlebolith. IMPRESSION: Nonobstructive bowel gas pattern. No acute cardiopulmonary disease. Electronically Signed   By: Elberta Fortis M.D.   On: 10/20/2017 16:36    Procedures Procedures (including critical care time)  Medications Ordered in ED Medications  sodium chloride 0.9 % bolus 1,000 mL (0 mLs Intravenous Stopped 10/20/17 1652)  sodium chloride 0.9 % bolus 1,000 mL (1,000 mLs Intravenous New Bag/Given 10/20/17 1714)      Initial Impression / Assessment and Plan / ED Course  I have reviewed the triage vital signs and the nursing notes.  Pertinent labs & imaging results that were available during my care of the patient were reviewed by me and considered in my medical decision making (see chart for details).    Patient's labs show AKI.  CT of the head unremarkable.  Suspect syncope is related to dehydration but he has had 3 episodes of syncope and will  be admitted for observation by the hospitalist  Final Clinical Impressions(s) / ED Diagnoses   Final diagnoses:  Syncope and collapse    ED Discharge Orders    None       Bethann Berkshire, MD 10/20/17 1910

## 2017-10-20 NOTE — ED Notes (Signed)
Patient given milk and coffee.

## 2017-10-20 NOTE — ED Notes (Signed)
Patient given decaf coffee.  

## 2017-10-21 ENCOUNTER — Observation Stay (HOSPITAL_BASED_OUTPATIENT_CLINIC_OR_DEPARTMENT_OTHER): Payer: BLUE CROSS/BLUE SHIELD

## 2017-10-21 ENCOUNTER — Observation Stay (HOSPITAL_COMMUNITY): Payer: BLUE CROSS/BLUE SHIELD

## 2017-10-21 DIAGNOSIS — R55 Syncope and collapse: Secondary | ICD-10-CM | POA: Diagnosis not present

## 2017-10-21 DIAGNOSIS — I5030 Unspecified diastolic (congestive) heart failure: Secondary | ICD-10-CM

## 2017-10-21 DIAGNOSIS — N179 Acute kidney failure, unspecified: Secondary | ICD-10-CM | POA: Diagnosis not present

## 2017-10-21 DIAGNOSIS — I10 Essential (primary) hypertension: Secondary | ICD-10-CM | POA: Diagnosis not present

## 2017-10-21 DIAGNOSIS — E7800 Pure hypercholesterolemia, unspecified: Secondary | ICD-10-CM | POA: Diagnosis not present

## 2017-10-21 DIAGNOSIS — N400 Enlarged prostate without lower urinary tract symptoms: Secondary | ICD-10-CM | POA: Diagnosis not present

## 2017-10-21 LAB — COMPREHENSIVE METABOLIC PANEL
ALK PHOS: 69 U/L (ref 38–126)
ALT: 39 U/L (ref 17–63)
ANION GAP: 9 (ref 5–15)
AST: 23 U/L (ref 15–41)
Albumin: 3.4 g/dL — ABNORMAL LOW (ref 3.5–5.0)
BILIRUBIN TOTAL: 0.7 mg/dL (ref 0.3–1.2)
BUN: 36 mg/dL — ABNORMAL HIGH (ref 6–20)
CALCIUM: 8.8 mg/dL — AB (ref 8.9–10.3)
CO2: 23 mmol/L (ref 22–32)
CREATININE: 1.03 mg/dL (ref 0.61–1.24)
Chloride: 108 mmol/L (ref 101–111)
GFR calc non Af Amer: >60 mL/min (ref >60)
GLUCOSE: 88 mg/dL (ref 65–99)
Potassium: 4.1 mmol/L (ref 3.5–5.1)
Sodium: 140 mmol/L (ref 135–145)
TOTAL PROTEIN: 6 g/dL — AB (ref 6.5–8.1)

## 2017-10-21 LAB — ECHOCARDIOGRAM COMPLETE
Height: 67 in
Weight: 4412.8 oz

## 2017-10-21 LAB — CBC
HCT: 39.4 % (ref 39.0–52.0)
HEMOGLOBIN: 12.4 g/dL — AB (ref 13.0–17.0)
MCH: 26.2 pg (ref 26.0–34.0)
MCHC: 31.5 g/dL (ref 30.0–36.0)
MCV: 83.3 fL (ref 78.0–100.0)
PLATELETS: 143 10*3/uL — AB (ref 150–400)
RBC: 4.73 MIL/uL (ref 4.22–5.81)
RDW: 15.5 % (ref 11.5–15.5)
WBC: 8.1 10*3/uL (ref 4.0–10.5)

## 2017-10-21 LAB — TROPONIN I

## 2017-10-21 LAB — HIV ANTIBODY (ROUTINE TESTING W REFLEX): HIV Screen 4th Generation wRfx: NONREACTIVE

## 2017-10-21 MED ORDER — PREMIER PROTEIN SHAKE: 11.0000 [oz_av] | Freq: Four times a day (QID) | ORAL | 0 refills | Status: AC

## 2017-10-21 MED ORDER — SODIUM CHLORIDE 0.9 % IV SOLN
INTRAVENOUS | Status: DC
  Administered 2017-10-21: 09:00:00 via INTRAVENOUS

## 2017-10-21 MED ORDER — CELECOXIB 200 MG PO CAPS: 200.0000 mg | ORAL_CAPSULE | Freq: Every day | ORAL | 0 refills | Status: DC

## 2017-10-21 MED ORDER — HYDROCHLOROTHIAZIDE 12.5 MG PO CAPS: 12.5000 mg | ORAL_CAPSULE | Freq: Every day | ORAL | 0 refills | Status: DC

## 2017-10-21 MED ORDER — LISINOPRIL 10 MG PO TABS: 10.0000 mg | ORAL_TABLET | Freq: Every day | ORAL | 0 refills | Status: DC

## 2017-10-21 MED ORDER — PERFLUTREN LIPID MICROSPHERE
INTRAVENOUS | Status: AC
  Filled 2017-10-21: qty 10

## 2017-10-21 MED ORDER — CELECOXIB 200 MG PO CAPS
200.0000 mg | ORAL_CAPSULE | Freq: Every day | ORAL | Status: DC
  Administered 2017-10-21: 200 mg via ORAL
  Filled 2017-10-21: qty 1

## 2017-10-21 NOTE — Progress Notes (Signed)
  Echocardiogram 2D Echocardiogram has been performed.  Roosvelt Maser F 10/21/2017, 2:47 PM

## 2017-10-21 NOTE — Progress Notes (Signed)
PT Cancellation Note  Patient Details Name: Anthony Carlson MRN: [id number] DOB: [birth year]   Cancelled Treatment:    Reason Eval/Treat Not Completed: Other (comment)(pt reports fall due to syncope and R knee 7/10 when standing)  Pt reports RN has notified MD and he will be having knee xray.  Pt reports he has power scooter for distances at baseline due to hx of myasthenia gravis and bil knee and ankle pain from arthritis.     [name],KATHrine E 10/21/2017, 11:55 AM Zenovia Jarred, PT, DPT 10/21/2017 Pager: [phone]

## 2017-10-21 NOTE — Discharge Summary (Signed)
Physician Discharge Summary  Anthony Carlson:[id number] DOB: 11/26/1960 DOA: 10/20/2017  PCP: Dema Severin, NP  Admit date: 10/20/2017 Discharge date: 10/21/2017  Admitted From: Home Disposition:  Home  Recommendations for Outpatient Follow-up:  1. Follow up with PCP in 1-2 weeks 2. Follow-up with neurology Dr. Nita Sickle for myasthenia gravis as well as outpatient EEG 3. Follow-up with Cardiology for 30-day monitor 4. Follow-up with orthopedics as an outpatient for right knee pain 5. Please obtain CMP/CBC, Mag, Phos in one week 6. Please follow up on the following pending results:  Home Health: No  Equipment/Devices: None    Discharge Condition: Stable  CODE STATUS: FULL CODE Diet recommendation: Bariatric Diet  Brief/Interim Summary: Anthony Carlson a56 y.o.male,w Myasthenia Gravis, Gerd, IBS, OSA on cpap, Hypertension, Morbid obeisity s/p gastric sleeve (Monday) apparently c/o syncope x3. Was brushing teeth the first time and the other 2x were in the shower. The last 2 were witnessed by his wife. He was sitting in the shower seat when he passed out. Out for just a few seconds. Felt slightly nauseated and dizziness prior to syncope. Denies cp, palp, emesis, diarrhea, incontinence, focal weakness, numbness, tingling. Pt notes that his bp medication has not been adjusted even though he has lost significant weight. He was admitted and worked up for syncope.  IV patient had a vasovagal episode from taking his blood pressure medications and they have been adjusted.  He will need to follow-up with Cardiology as an outpatient for 30-day monitor and have an outpatient EEG done with his primary neurologist team.  Patient was deemed medically stable to be discharged at this time and will follow-up with PCP within 1 week.  Discharge Diagnoses:  Principal Problem:   Syncope Active Problems:   Morbid obesity (HCC)   ARF (acute renal failure) (HCC)   BPH (benign prostatic  hyperplasia)   Hypertension   Hyperlipidemia  Acute Syncope include vasovagal in the setting of multiple Antihypertensives -Continue with Telemetry Monitoring -Cycled Trop I q6h x3 -Orthostatic Vital Signs done and patient not Orthostatic but blood pressures remain on the lower side -Continue IV fluid hydration as below -Carotid Dopplers showed 1-39% ICA Stenosis bilaterally  -Cardiac Troponins were less than 0.033 -TSH was 0.394 -ECHOCardiogram showed EF of 55 to 60% with grade 1 diastolic dysfunction.  Normal left ventricular systolic function, moderate LVH; mild diastolic dysfunction; mild LAE -Obtained Head CT and showed Acute Findings -Ordered EEG after discussion patient will have it done as an outpatient with his primary neurologist -PT/OT Evaluate and Treat and did not have any recommendations this patient will be going back to his home environment and going back on his scooter secondary to myasthenia gravis -We will have a 30-day monitor at discharge to monitor for any arrhythmias have sent a referral to cardiology clinic. -Follow-up with PCP and cardiology as an outpatient  Mild ARF, improved -Stopped Diclofenac -BUN/Creatinine from 46/1.59 and is now improved to 36/1.03 -Given IV fluid hydration with normal sinus 75 mils per hour  -Decreased Lisinopril /hydrochlorothiazide to 10/12.5mg  po qday -Avoid nephrotoxic medications if possible; Started Celebrex 200 mg poDaily  -Continue to monitor and repeat CMP as an outpatient.  OSA on Cpap -Continue CPAP at home setting  Morbid obeisity BMI 44.36 -S/p gastric sleeve -Bariatric diet  Essential Hypertension -Stop Doxazosin -C/w Metoprolol 50 mg po BID -Decrease Lisinopril/Hydrochlorothiazide as above -Pressures have been on the lower side stable.  BPH -STOP Doxazosin as likely contributed to patient's syncope and likely it was vasovagal -  Pt thinks doesn't need since had TURP -C/w Hyoscamine 0.125 mg SL  TIDprn -Follow-up outpatient with PCP  Hyperlipidemia -Continue with Atorvastatin 40 mg p.o. daily  Arthritis -Has Chronic back pain -Will Continue Gabapentin 600 mg po TID -Will start Celebrex 200 mg po Daily  and will write a prescription for discharge  IBS -Continue Hyoscyamine 0.125mg  SL TIDprn Bladder Spasms and GI  Myasthenia Gravis -C/w Mycophenolate 1000 mg po BID -Continue with home Modafinil  GERD -C/w Pantoprazole 40 mg po Daily  Gout  -C/w Febuxostat 40 mg po Daily   Right Knee Pain -Obtain Knee X-Ray -Knee X-Ray showed "Moderate osteoarthritic change centered on the lateral and patellofemoral compartments with moderate to severe degenerative changes of the medial compartment. There is a probable osteochondral defect in the periphery of the Lat medial femoral condyle. There is some mild flattening of the medial tibial plateau which is not clearly acute.  Discharge Instructions  Discharge Instructions    Call MD for:  difficulty breathing, headache or visual disturbances   Complete by:  As directed    Call MD for:  extreme fatigue   Complete by:  As directed    Call MD for:  hives   Complete by:  As directed    Call MD for:  persistant dizziness or light-headedness   Complete by:  As directed    Call MD for:  persistant nausea and vomiting   Complete by:  As directed    Call MD for:  redness, tenderness, or signs of infection (pain, swelling, redness, odor or green/yellow discharge around incision site)   Complete by:  As directed    Call MD for:  severe uncontrolled pain   Complete by:  As directed    Call MD for:  temperature >100.4   Complete by:  As directed    Diet - low sodium heart healthy   Complete by:  As directed    Discharge instructions   Complete by:  As directed    Follow-up with PCP at discharge along with Neurology for outpatient EEG.  Referral was made for you to the Cardiology clinic for a 30-day monitor.  They will be calling  you to schedule follow-up. Take all medications as prescribed.  If symptoms change or worsen please return to the emergency room for evaluation.   Increase activity slowly   Complete by:  As directed      Allergies as of 10/21/2017      Reactions   Avelox [moxifloxacin Hcl In Nacl] Anaphylaxis   Pyridostigmine Diarrhea, Nausea Only   Hydromorphone Other (See Comments)   Agitation       Medication List    STOP taking these medications   diclofenac 75 MG EC tablet Commonly known as:  VOLTAREN   doxazosin 8 MG tablet Commonly known as:  CARDURA   lisinopril-hydrochlorothiazide 20-25 MG tablet Commonly known as:  PRINZIDE,ZESTORETIC   sildenafil 20 MG tablet Commonly known as:  REVATIO     TAKE these medications   acetaminophen 500 MG tablet Commonly known as:  TYLENOL Take 1,000 mg by mouth 2 (two) times daily as needed for moderate pain or headache.   atorvastatin 40 MG tablet Commonly known as:  LIPITOR Take 40 mg by mouth at bedtime.   benzoyl peroxide 10 % gel Apply 1 application topically daily as needed (acne).   celecoxib 200 MG capsule Commonly known as:  CELEBREX Take 1 capsule (200 mg total) by mouth daily.   clindamycin 1 %  external solution Commonly known as:  CLEOCIN T Apply 1 application topically daily as needed (acne).   DEPO-TESTOSTERONE 200 MG/ML injection Generic drug:  testosterone cypionate Inject 200 mg into the muscle every 14 (fourteen) days.   diclofenac sodium 1 % Gel Commonly known as:  VOLTAREN Apply 1 application topically 4 (four) times daily as needed for pain.   DYMISTA 137-50 MCG/ACT Susp Generic drug:  Azelastine-Fluticasone Place 2 sprays into the nose 2 (two) times daily as needed (allergies).   gabapentin 600 MG tablet Commonly known as:  NEURONTIN Take 600 mg by mouth 3 (three) times daily.   hydrochlorothiazide 12.5 MG capsule Commonly known as:  MICROZIDE Take 1 capsule (12.5 mg total) by mouth daily.    hyoscyamine 0.125 MG SL tablet Commonly known as:  LEVSIN SL Place 0.125 mg under the tongue 3 (three) times daily as needed (bladder spasms and GI).   lisinopril 10 MG tablet Commonly known as:  PRINIVIL,ZESTRIL Take 1 tablet (10 mg total) by mouth daily.   metoprolol tartrate 50 MG tablet Commonly known as:  LOPRESSOR Take 50 mg by mouth 2 (two) times daily.   modafinil 200 MG tablet Commonly known as:  PROVIGIL Take 200 mg by mouth daily.   multivitamin with minerals Tabs tablet Take 1 tablet by mouth daily.   mycophenolate 500 MG tablet Commonly known as:  CELLCEPT Take 1,000 mg by mouth 2 (two) times daily.   omeprazole 40 MG capsule Commonly known as:  PRILOSEC Take 40 mg by mouth 2 (two) times daily.   protein supplement shake Liqd Commonly known as:  PREMIER PROTEIN Take 325 mLs (11 oz total) by mouth 4 (four) times daily.   tadalafil 20 MG tablet Commonly known as:  CIALIS Take 20 mg by mouth daily as needed for erectile dysfunction.   ULORIC 40 MG tablet Generic drug:  febuxostat Take 40 mg by mouth daily.   Vitamin D3 5000 units Caps Take 1 capsule by mouth daily.      Follow-up Information    Dema Severin, NP. Call.   Why:  Follow up within 1-2 weeks Contact information: [address] [place] [address] [phone]        [facility]. Call.   Why:  Cardiology clinic will call to set up 30 day monitor in the coming weeks Contact information: [address] [place] [address] [phone]       Glendale Chard, DO. Call.   Specialty:  Neurology Why:  Follow up at D/C and have outpatient EEG Contact information: [address] [place] [address] [phone]          Allergies  Allergen Reactions  . Avelox [Moxifloxacin Hcl In Nacl] Anaphylaxis  . Pyridostigmine Diarrhea and Nausea Only  . Hydromorphone Other (See Comments)     Agitation    Consultations:  None  Procedures/Studies: Ct Head Wo Contrast  Result Date: 10/20/2017 CLINICAL DATA:  Syncopal episode today with fall. EXAM: CT HEAD WITHOUT CONTRAST TECHNIQUE: Contiguous axial images were obtained from the base of the skull through the vertex without intravenous contrast. COMPARISON:  MRI brain 03/10/2016 FINDINGS: Brain: Ventricles, cisterns and other CSF spaces are within normal. There is no mass, mass effect, shift of midline structures or acute hemorrhage. No evidence of acute infarction. Vascular: No hyperdense vessel or unexpected calcification. Skull: Normal. Negative for fracture or focal lesion. Sinuses/Orbits: Orbits are normal. Paranasal sinuses are well developed  with minimal mucosal membrane thickening involving the right maxillary sinus. Mastoid air cells are clear. Other: None. IMPRESSION: No acute findings. Electronically Signed   By: Elberta Fortis M.D.   On: 10/20/2017 16:47   Dg Knee Complete 4 Views Right  Result Date: 10/21/2017 CLINICAL DATA:  Diffuse right knee pain after falling last night. EXAM: RIGHT KNEE - COMPLETE 4+ VIEW COMPARISON:  Right knee series of Nov 07, 2006. FINDINGS: The bones are subjectively adequately mineralized. There is moderate narrowing of the lateral compartment and moderate to severe narrowing of the medial compartment. There is irregularity of the articular surface of the medial femoral condyle and medial tibial plateau. No definite acute fracture is observed. There is old deformity of the shaft of the proximal fibula. There is degenerative spurring of the articular margins of the patella and adjacent femur. There is a small suprapatellar effusion. IMPRESSION: Moderate osteoarthritic change centered on the lateral and patellofemoral compartments with moderate to severe degenerative changes of the medial compartment. There is a probable osteochondral defect in the periphery of the Lat medial femoral condyle. There is some  mild flattening of the medial tibial plateau which is not clearly acute. Electronically Signed   By: David  [place] M.D.   On: 10/21/2017 13:42   Dg Abd Acute W/chest  Result Date: 10/20/2017 CLINICAL DATA:  Syncope with fall today. Gastric sleeve surgery last week. EXAM: DG ABDOMEN ACUTE W/ 1V CHEST COMPARISON:  Chest x-ray 01/25/2010 FINDINGS: Lungs are adequately inflated and otherwise clear. Cardiomediastinal silhouette is within normal. Remainder of the chest is unchanged. Abdominopelvic images demonstrate a nonobstructive bowel gas pattern with mild fecal retention throughout the colon. No mass or mass effect. No free peritoneal air. Mild degenerate change of the spine. Left pelvic phlebolith. IMPRESSION: Nonobstructive bowel gas pattern. No acute cardiopulmonary disease. Electronically Signed   By: Elberta Fortis M.D.   On: 10/20/2017 16:36   CAROTID DOPPLERS Carotid artery duplexhas been completed. 1-39% ICA stenosis bilaterally.  ECHOCARDIOGRAM ------------------------------------------------------------------- Study Conclusions  - Left ventricle: The cavity size was normal. Wall thickness was increased in a pattern of moderate LVH. Systolic function was normal. The estimated ejection fraction was in the range of 55% to 60%. Wall motion was normal; there were no regional wall motion abnormalities. Doppler parameters are consistent with abnormal left ventricular relaxation (grade 1 diastolic dysfunction). - Left atrium: The atrium was mildly dilated.  Impressions:  - Normal LV systolic function; moderate LVH; mild diastolic dysfunction; mild LAE.  EEG Ordered and was pending to be done but will cancel it and have it done as an outpatient as it will not be able to be done until tomorrow  Subjective: Seen and examined at bedside and was much improved.  No nausea, vomiting, lightheadedness, dizziness.  Felt better and had no other concerns or complaints.  Deemed  stable be discharged home and needs to follow with PCP as well as Cardiology and Neurology in an outpatient setting  Discharge Exam: Vitals:   10/21/17 0857 10/21/17 1347  BP: 110/61 121/69  Pulse: 90 79  Resp:  18  Temp:  98 F (36.7 C)  SpO2:  95%   Vitals:   10/21/17 0854 10/21/17 0856 10/21/17 0857 10/21/17 1347  BP: 122/60 123/63 110/61 121/69  Pulse:  74 90 79  Resp: 16   18  Temp:    98 F (36.7 C)  TempSrc:    Oral  SpO2:    95%  Weight:      Height:  General: Pt is a morbidly obese Caucasian Male who is alert, awake, not in acute distress Cardiovascular: RRR, S1/S2 +, no rubs, no gallops Respiratory: Diminished bilaterally, no wheezing, no rhonchi Abdominal: Soft, NT, Distended due to body habitus, bowel sounds + Extremities: no edema, no cyanosis  The results of significant diagnostics from this hospitalization (including imaging, microbiology, ancillary and laboratory) are listed below for reference.    Microbiology: No results found for this or any previous visit (from the past 240 hour(s)).   Labs: BNP (last 3 results) No results for input(s): BNP in the last 8760 hours. Basic Metabolic Panel: Recent Labs  Lab 10/20/17 1549 10/21/17 0254  NA 141 140  K 3.8 4.1  CL 108 108  CO2 24 23  GLUCOSE 102* 88  BUN 46* 36*  CREATININE 1.59* 1.03  CALCIUM 9.0 8.8*   Liver Function Tests: Recent Labs  Lab 10/20/17 1549 10/21/17 0254  AST 21 23  ALT 45 39  ALKPHOS 73 69  BILITOT 0.7 0.7  PROT 6.5 6.0*  ALBUMIN 3.6 3.4*   No results for input(s): LIPASE, AMYLASE in the last 168 hours. No results for input(s): AMMONIA in the last 168 hours. CBC: Recent Labs  Lab 10/20/17 1549 10/21/17 0254  WBC 10.7* 8.1  NEUTROABS 8.7*  --   HGB 13.1 12.4*  HCT 41.6 39.4  MCV 83.2 83.3  PLT 151 143*   Cardiac Enzymes: Recent Labs  Lab 10/20/17 2111 10/21/17 0254 10/21/17 0835  TROPONINI <0.03 <0.03 <0.03   BNP: Invalid input(s):  POCBNP CBG: No results for input(s): GLUCAP in the last 168 hours. D-Dimer No results for input(s): DDIMER in the last 72 hours. Hgb A1c No results for input(s): HGBA1C in the last 72 hours. Lipid Profile No results for input(s): CHOL, HDL, LDLCALC, TRIG, CHOLHDL, LDLDIRECT in the last 72 hours. Thyroid function studies Recent Labs    10/20/17 2111  TSH 0.394   Anemia work up No results for input(s): VITAMINB12, FOLATE, FERRITIN, TIBC, IRON, RETICCTPCT in the last 72 hours. Urinalysis    Component Value Date/Time   COLORURINE AMBER (A) 04/14/2016 0747   APPEARANCEUR CLEAR 04/14/2016 0747   LABSPEC 1.017 04/14/2016 0747   PHURINE 6.5 04/14/2016 0747   GLUCOSEU NEGATIVE 04/14/2016 0747   HGBUR NEGATIVE 04/14/2016 0747   BILIRUBINUR NEGATIVE 04/14/2016 0747   KETONESUR NEGATIVE 04/14/2016 0747   PROTEINUR NEGATIVE 04/14/2016 0747   UROBILINOGEN 0.2 01/25/2010 1547   NITRITE NEGATIVE 04/14/2016 0747   LEUKOCYTESUR NEGATIVE 04/14/2016 0747   Sepsis Labs Invalid input(s): PROCALCITONIN,  WBC,  LACTICIDVEN Microbiology No results found for this or any previous visit (from the past 240 hour(s)).  Time coordinating discharge: 35 minutes  SIGNED:  Merlene Laughter, DO [facility] 10/22/2017, 7:22 PM Pager [phone]  If 7PM-7AM, please contact night-coverage [email or web address] Password [email or web address]

## 2017-10-21 NOTE — Progress Notes (Signed)
OT Cancellation Note  Patient Details Name: Anthony Carlson MRN: [id number] DOB: 12-28-1960   Cancelled Treatment:    Reason Eval/Treat Not Completed: Pain limiting ability to participate.  Per PT, pt reports he fell on his knee and is awaiting and x-ray.  Too painful to mobilize.  Will reattempt.  Reginna Sermeno [place], OTR/L [phone]   Jeani Hawking M 10/21/2017, 11:53 AM

## 2017-10-21 NOTE — Progress Notes (Signed)
Patient discharged home, discharge instructions given abd explained to patient/wife, they verbalized understanding, patient denies any pain/distress. No wound noted, skin intact. Accompanied home by wife.

## 2017-10-21 NOTE — Care Management Note (Signed)
Case Management Note  Patient Details  Name: Anthony Carlson MRN: [id number] Date of Birth: [birth year]  Subjective/Objective:56 y/o m admitted w/syncope. From home. PT cons-await recc.                    Action/Plan:d/c home.   Expected Discharge Date:                  Expected Discharge Plan:  Home/Self Care  In-House Referral:     Discharge planning Services  CM Consult  Post Acute Care Choice:    Choice offered to:     DME Arranged:    DME Agency:     HH Arranged:    HH Agency:     Status of Service:  In process, will continue to follow  If discussed at Long Length of Stay Meetings, dates discussed:    Additional Comments:  Lanier Clam, RN 10/21/2017, 11:25 AM

## 2017-10-21 NOTE — Progress Notes (Signed)
Carotid artery duplex has been completed. 1-39% ICA stenosis bilaterally.  10/21/17 9:38 AM Olen Cordial RVT

## 2017-10-21 NOTE — Progress Notes (Addendum)
Visited with patient and wife.  Discussed the events leading up to readmission including syncopal episodes at home.  Patient voices concerns over current hospital diet.  Discussed with bedside RN patient should be on bari full liquid diet due to recent bariatric surgery.  Patient wife had several dietary concerns including fluid/protein intake at home.  Patient states he was been reaching goal of 64 ounces of fluid and 90 grams of protein at home.  We did discuss the need for decaf coffee at home.  Patient states understanding.  No other questions at this time.  Contact information provided to patient and wife.  Corene Cornea MSN, RN-BC Bariatric Nurse Coordinator Pager # [phone]

## 2017-10-22 ENCOUNTER — Other Ambulatory Visit: Payer: Self-pay | Admitting: Cardiology

## 2017-10-22 ENCOUNTER — Encounter: Payer: BLUE CROSS/BLUE SHIELD | Admitting: Registered"

## 2017-10-22 DIAGNOSIS — Z6841 Body Mass Index (BMI) 40.0 and over, adult: Secondary | ICD-10-CM | POA: Diagnosis not present

## 2017-10-22 DIAGNOSIS — Z713 Dietary counseling and surveillance: Secondary | ICD-10-CM | POA: Diagnosis present

## 2017-10-22 DIAGNOSIS — E669 Obesity, unspecified: Secondary | ICD-10-CM

## 2017-10-22 DIAGNOSIS — R55 Syncope and collapse: Secondary | ICD-10-CM

## 2017-10-25 NOTE — Progress Notes (Signed)
Bariatric Class:  Appt start time: 1530 end time:  1630.  2 Week Post-Operative Nutrition Class  Patient was seen on 10/22/2017 for Post-Operative Nutrition education at [facility].   Surgery date: 10/14/2017 Surgery type: Sleeve  Start weight at [facility]: 288.2 Weight today: 268.5 Weight change: 19.7 lbs loss  Pt reports drinking 120+ ounces of fluids a day and 90 grams of protein. Pt states she had syncope episode due to blood pressure medications; medications have been decreased.   TANITA  BODY COMP RESULTS  10/22/2017   BMI (kg/m^2) Pt declined   Fat Mass (lbs)    Fat Free Mass (lbs)    Total Body Water (lbs)    The following the learning objectives were met by the patient during this course:  Identifies Phase 3A (Soft, High Proteins) Dietary Goals and will begin from 2 weeks post-operatively to 2 months post-operatively  Identifies appropriate sources of fluids and proteins   States protein recommendations and appropriate sources post-operatively  Identifies the need for appropriate texture modifications, mastication, and bite sizes when consuming solids  Identifies appropriate multivitamin and calcium sources post-operatively  Describes the need for physical activity post-operatively and will follow MD recommendations  States when to call healthcare provider regarding medication questions or post-operative complications  Handouts given during class include:  Phase 3A: Soft, High Protein Diet Handout  Follow-Up Plan: Patient will follow-up at [facility] in 6 weeks for 2 month post-op nutrition visit for diet advancement per MD.

## 2017-10-29 ENCOUNTER — Ambulatory Visit: Payer: BLUE CROSS/BLUE SHIELD

## 2017-11-01 ENCOUNTER — Telehealth: Payer: Self-pay | Admitting: Registered"

## 2017-11-01 NOTE — Telephone Encounter (Signed)
RD called pt to verify fluid intake once starting soft, solid proteins 2 week post-bariatric surgery.   Daily Fluid intake: 64-100 ounces Daily Protein intake: 80+ grams  Concerns/issues: none stated

## 2017-12-04 ENCOUNTER — Encounter: Payer: Self-pay | Admitting: Registered"

## 2017-12-04 ENCOUNTER — Encounter: Payer: BLUE CROSS/BLUE SHIELD | Attending: General Surgery | Admitting: Registered"

## 2017-12-04 DIAGNOSIS — Z713 Dietary counseling and surveillance: Secondary | ICD-10-CM | POA: Insufficient documentation

## 2017-12-04 DIAGNOSIS — Z6841 Body Mass Index (BMI) 40.0 and over, adult: Secondary | ICD-10-CM | POA: Diagnosis not present

## 2017-12-04 DIAGNOSIS — E669 Obesity, unspecified: Secondary | ICD-10-CM

## 2017-12-04 NOTE — Progress Notes (Signed)
Follow-up visit:  8 Weeks Post-Operative Sleeve Gastrectomy Surgery  Medical Nutrition Therapy:  Appt start time: 4:50 end time:  5:30.  Primary concerns today: Post-operative Bariatric Surgery Nutrition Management.  Non scale victories: wearing smaller sizes, wearing waist 52 to size 42, reduce blood pressure medication, increase mobility, able to walk further, can stand longer  Surgery date: 10/14/2017 Surgery type: Sleeve  [facility] 288.2 Weight today: 260.1 Weight change: 8.4 lbs loss from 268.5 (10/22/2017) Total weight lost: 28.1 lbs Weight loss goal: increase mobility, no longer dependent on wheelchair, improve health, reduce bp medications, prevent diabetes   TANITA  BODY COMP RESULTS  10/22/2017 12/04/2017   BMI (kg/m^2) Pt declined Pt declined   Fat Mass (lbs)     Fat Free Mass (lbs)     Total Body Water (lbs)     Pt states he feels good. Pt states he had a grilled chicken caesar salad on Monday; tolerated well. Pt states he was having fruit/spinach smoothies as breakfast for 2 weeks. Pt states he is using [employer]. Pt states he drinks up until 5 minutes before eating because he feels like food will get stuck. Pt states he has experienced some chewing/swallowing difficulties.   Preferred Learning Style:   No preference indicated   Learning Readiness:   Ready  Change in progress  24-hr recall: B (AM): protein shake (30g) Snk (10 AM): beef stick (7g)  L (PM): 2 oz grilled chicken breast or grilled shrimp/salmon (14g) Snk (PM): sometimes Muscle Milk (40g)   D (PM): 1/2 [employer]-ham and cheese (15g) Snk (PM): none  Fluid intake: coffee, water; 60-69 ounces Estimated total protein intake: 80+ grams  Medications: See list Supplementation: Pro[employer] health multi with iron capsules + 2-3 calcium supplements  Using straws: no Drinking while eating: sips sometimes Having you been chewing well: yes Chewing/swallowing difficulties: no Changes in  vision: no Changes to mood/headaches: no Hair loss/Changes to skin/Changes to nails: no, no, no Any difficulty focusing or concentrating: no Sweating: no Dizziness/Lightheaded: no Palpitations: only when not taking metoprolol directed by cardiologist Carbonated beverages: no N/V/D/C/GAS: no, no, no, yes, no Abdominal Pain: no Dumping syndrome: no Last Lap-Band fill: N/A  Recent physical activity:  Walking, bowflex machine 1500 steps/day, 7 days/week   Progress Towards Goal(s):  In progress.  Handouts given during visit include:  Phase IV: High Protein + NS vegetables   Nutritional Diagnosis:  [place]-3.3 Overweight/obesity related to past poor dietary habits and physical inactivity as evidenced by patient w/ recent sleeve gastrectomy surgery following dietary guidelines for continued weight loss.    Intervention:  Nutrition education and  Counseling. Pt was educated and counseled on the following phase of the diet, how to incorporate non-starchy vegetables into his daily regimen.  Goals:  Follow Phase 3B: High Protein + Non-Starchy Vegetables  Eat 3-6 small meals/snacks, every 3-5 hrs  Increase lean protein foods to meet 30g goal  Increase fluid intake to 64oz +  Avoid drinking 15 minutes before, during and 30 minutes after eating  Aim for >30 min of physical activity daily  Teaching Method Utilized:  Visual Auditory Hands on  Barriers to learning/adherence to lifestyle change: none identified  Demonstrated degree of understanding via:  Teach Back   Monitoring/Evaluation:  Dietary intake, exercise, lap band fills, and body weight. Follow up in 4 months for 6 month post-op visit.

## 2017-12-04 NOTE — Patient Instructions (Signed)
Goals:  Follow Phase 3B: High Protein + Non-Starchy Vegetables  Eat 3-6 small meals/snacks, every 3-5 hrs  Increase lean protein foods to meet 30g goal  Increase fluid intake to 64oz +  Avoid drinking 15 minutes before, during and 30 minutes after eating  Aim for >30 min of physical activity daily

## 2017-12-16 ENCOUNTER — Ambulatory Visit: Payer: BLUE CROSS/BLUE SHIELD | Admitting: Interventional Cardiology

## 2018-04-02 ENCOUNTER — Encounter: Payer: Self-pay | Admitting: Registered"

## 2018-04-02 ENCOUNTER — Encounter: Payer: BLUE CROSS/BLUE SHIELD | Attending: General Surgery | Admitting: Registered"

## 2018-04-02 DIAGNOSIS — Z6841 Body Mass Index (BMI) 40.0 and over, adult: Secondary | ICD-10-CM | POA: Insufficient documentation

## 2018-04-02 DIAGNOSIS — E669 Obesity, unspecified: Secondary | ICD-10-CM

## 2018-04-02 DIAGNOSIS — Z713 Dietary counseling and surveillance: Secondary | ICD-10-CM | POA: Diagnosis not present

## 2018-04-02 NOTE — Progress Notes (Signed)
Follow-up visit:  6 Months Post-Operative Sleeve Gastrectomy Surgery  Medical Nutrition Therapy:  Appt start time: 4:25 end time:  5:02.  Primary concerns today: Post-operative Bariatric Surgery Nutrition Management.  Non scale victories: wearing smaller sizes, wearing waist 52 to size 42, reduce blood pressure medication, increase mobility, able to walk further, can stand longer, increased daily steps, increased stamina  Surgery date: 10/14/2017 Surgery type: Sleeve  Start weight at [facility]: 288.2 Weight today: 216.7 Weight change: 43.4 lbs loss from 260.1 (12/04/2017) Total weight lost: 71.5 lbs Weight loss goal: increase mobility, no longer dependent on wheelchair, improve health, reduce blood pressure medications, prevent diabetes   TANITA  BODY COMP RESULTS  10/22/2017 12/04/2017 04/02/2018   BMI (kg/m^2) Pt declined Pt declined Pt declined   Fat Mass (lbs)      Fat Free Mass (lbs)      Total Body Water (lbs)      Pt states he has daily step goal of 1900 steps/day and meeting it 3-4 days/week. Pt states his kneees and ankles need replacing but he is unable to have joint replacement surgery in the near future due to wanting to hold on to his job. Pt states he will likely look into that once he has retired. Pt states he will continue to drink protein shakes for breakfast because it is more convenient especially when he has to take his son to school. Pt states recent lab results are good. Pt reports history of IBS-D. Pt states he is having bowel movements 3-4 times/week.   Pt states he does not need anymore follow-up visits with [place]. Pt states he handles most food well, has already started eating fruit and tolerated a cookie well the other day. Pt states he recognizes that this is his process, his surgery, and can handle things moving forward.    Preferred Learning Style:   No preference indicated   Learning Readiness:   Ready  Change in progress  24-hr recall: B (AM): protein  shake (30g) + 1/3 protein bar  Snk (10 AM): 2/3 protein bar  L (PM): bacon wrap shrimp + cheese + avocado salad + beans (21g) Snk (PM): sometimes beef jerky (7g)  D (PM): [employer]-salad + chicken tenders (21g) or [employer]- 1/3 grilled chicken (10g) + bacon (3g) Snk (PM): none  Fluid intake: coffee, water sometimes with flavor; 80 ounces Estimated total protein intake: 80+ grams  Medications: See list Supplementation: Procare health multi with iron capsules + 2 calcium supplements + B complex vitamins with Vitamin C  Using straws: no Drinking while eating: sips sometimes Having you been chewing well: yes Chewing/swallowing difficulties: no Changes in vision: no Changes to mood/headaches: no Hair loss/Changes to skin/Changes to nails: hair thinning, no, no Any difficulty focusing or concentrating: no Sweating: no Dizziness/Lightheaded: no Palpitations: no Carbonated beverages: no N/V/D/C/GAS: no, no, no, yes, no Abdominal Pain: no Dumping syndrome: no Last Lap-Band fill: N/A  Recent physical activity:  Walking 1900 steps/day, 3-4 days/week   Progress Towards Goal(s):  In progress.  Handouts given during visit include:  Phase V: High Protein + All vegetables   Nutritional Diagnosis:  [place]-3.3 Overweight/obesity related to past poor dietary habits and physical inactivity as evidenced by patient w/ recent sleeve gastrectomy surgery following dietary guidelines for continued weight loss.    Intervention:  Nutrition education and  Counseling. Pt was educated and counseled on the following phase of the diet, how to incorporate starchy vegetables into his daily regimen. Pt was in agreement with goals  listed.  Goals: - Add in 3rd calcium supplement daily.   Teaching Method Utilized:  Visual Auditory Hands on  Barriers to learning/adherence to lifestyle change: none identified  Demonstrated degree of understanding via:  Teach Back   Monitoring/Evaluation:  Dietary intake,  exercise, lap band fills, and body weight. Follow up prn.

## 2018-04-02 NOTE — Patient Instructions (Addendum)
-   Add in 3rd calcium supplement daily.

## 2019-01-21 IMAGING — CT CT HEAD W/O CM
3 series · 15 of 47 positions shown, 18 images · non-contrast
Comparison: MRI brain 03/10/2016

CLINICAL DATA: Syncopal episode today with fall.

EXAM:
CT HEAD WITHOUT CONTRAST
TECHNIQUE: Contiguous axial images were obtained from the base of the skull
through the vertex without intravenous contrast.

[Series 2: head wo · axial · 0.43mm/px · z∈[-121,+14]mm · 9 of 33 slices shown, 12 images]
[im 3/33  brain]
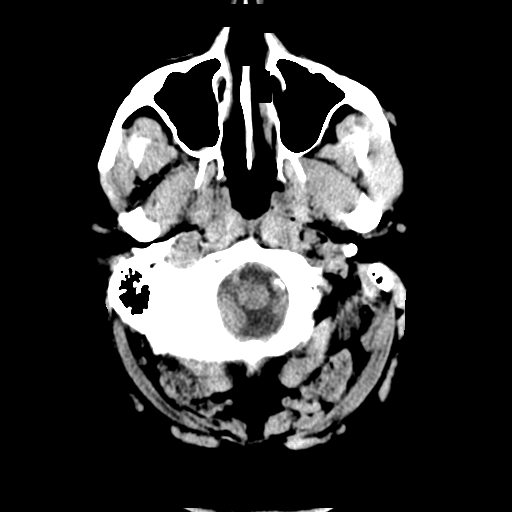
[im 3/33  bone]
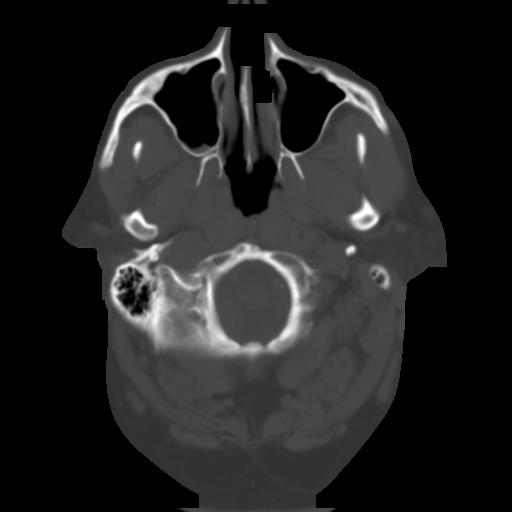
[im 6/33  brain]
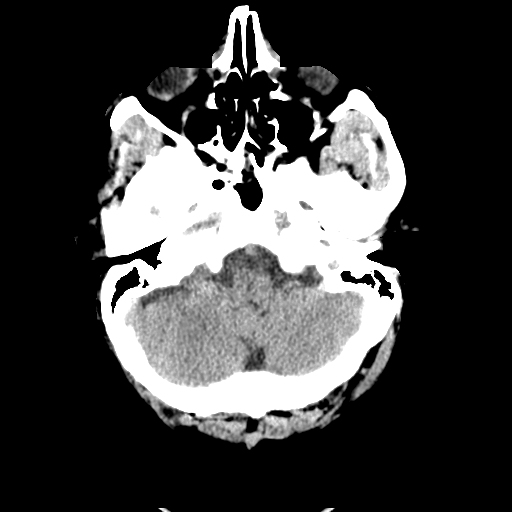
[im 9/33  brain]
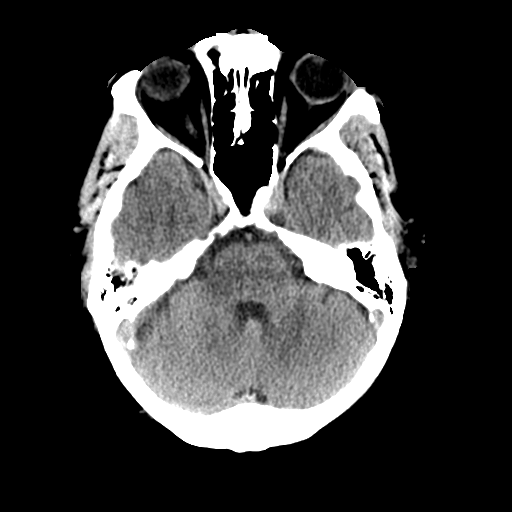
[im 13/33  brain]
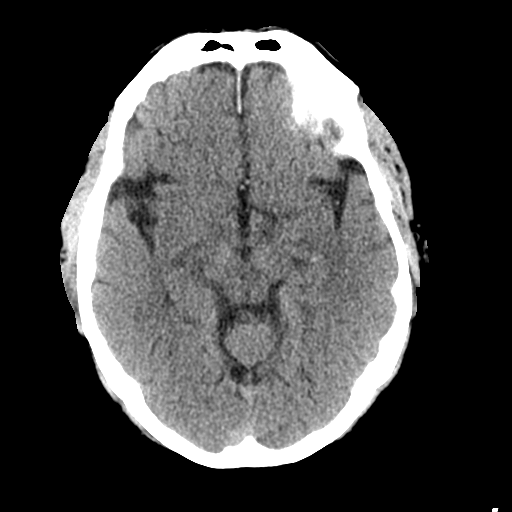
[im 17/33  brain]
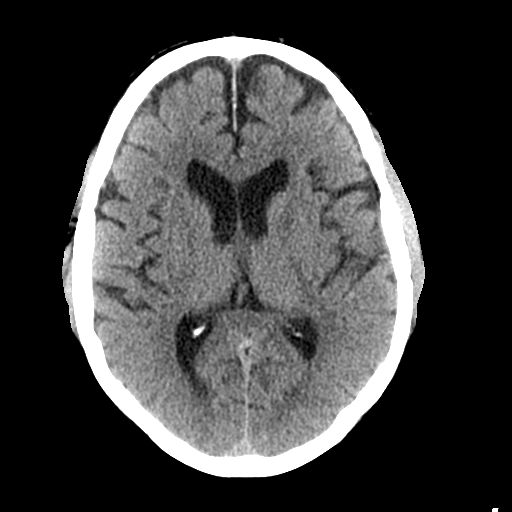
[im 17/33  bone]
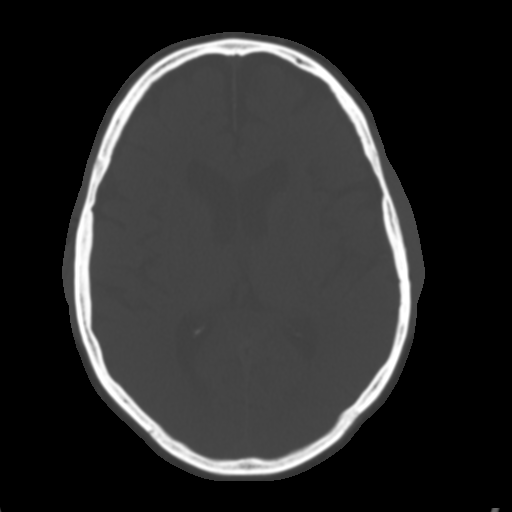
[im 20/33  brain]
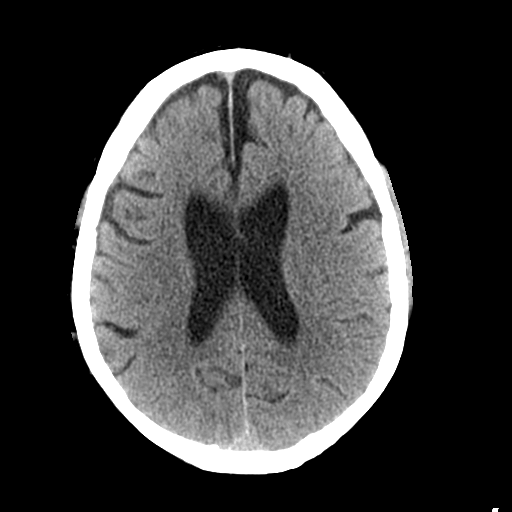
[im 24/33  brain]
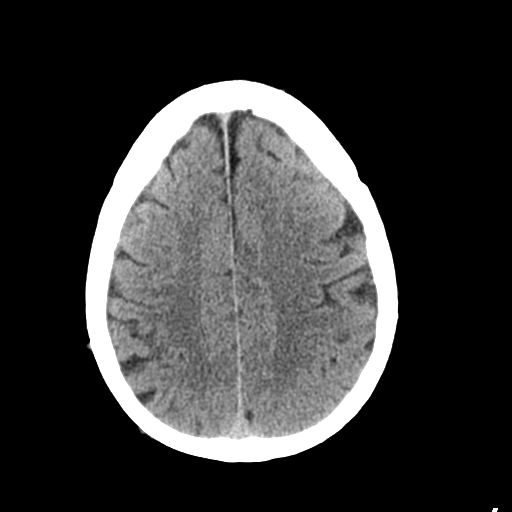
[im 27/33  brain]
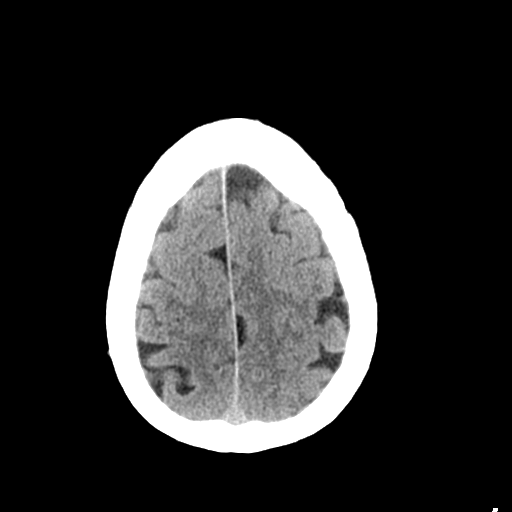
[im 30/33  brain]
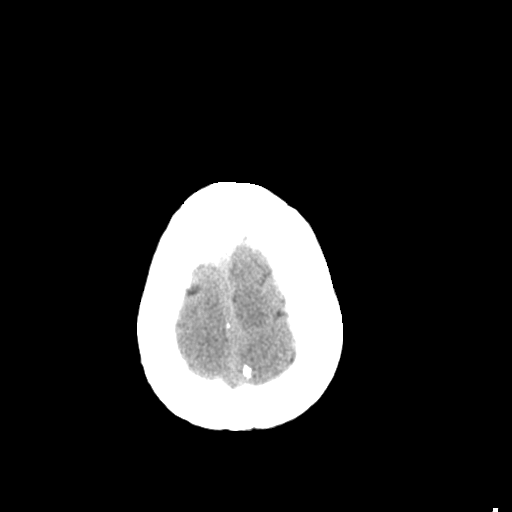
[im 30/33  bone]
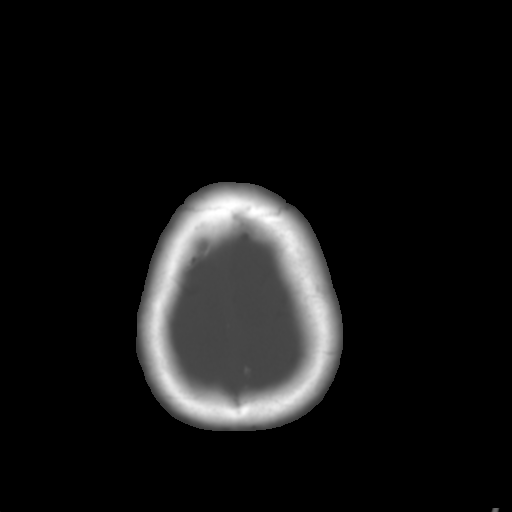

[Series 5: coronal soft tissue · coronal · 0.34mm/px · 3 of 80 slices shown]
[im 27/80  brain]
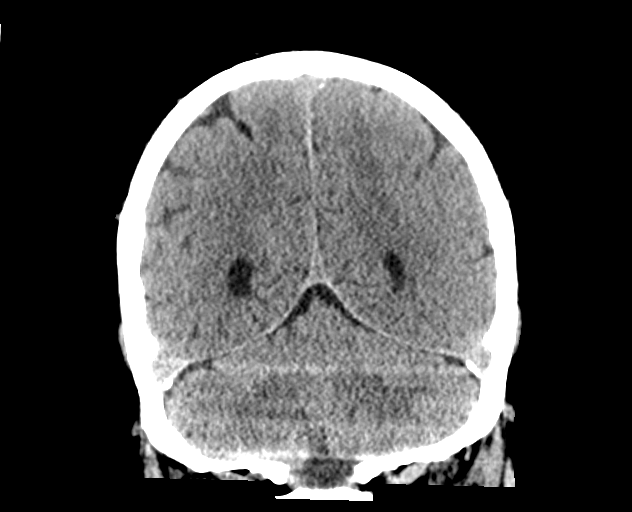
[im 36/80  brain]
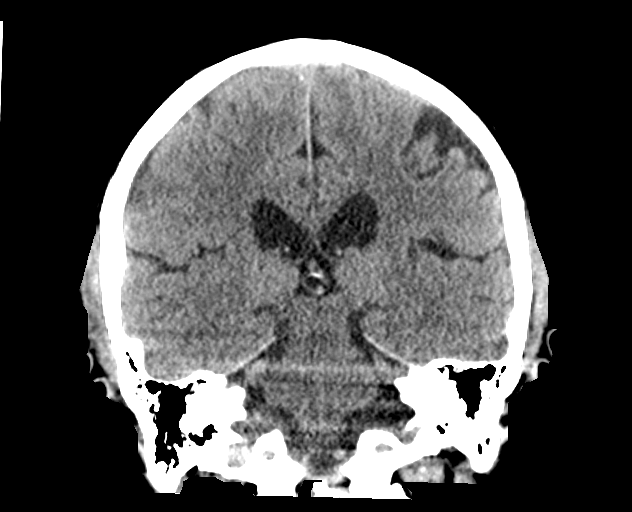
[im 44/80  brain]
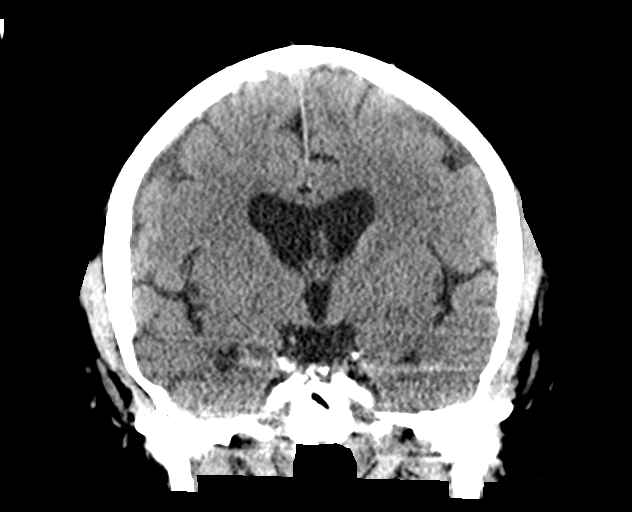

[Series 6: sagittal soft tissue · sagittal · 0.32mm/px · 3 of 64 slices shown]
[im 22/64  brain]
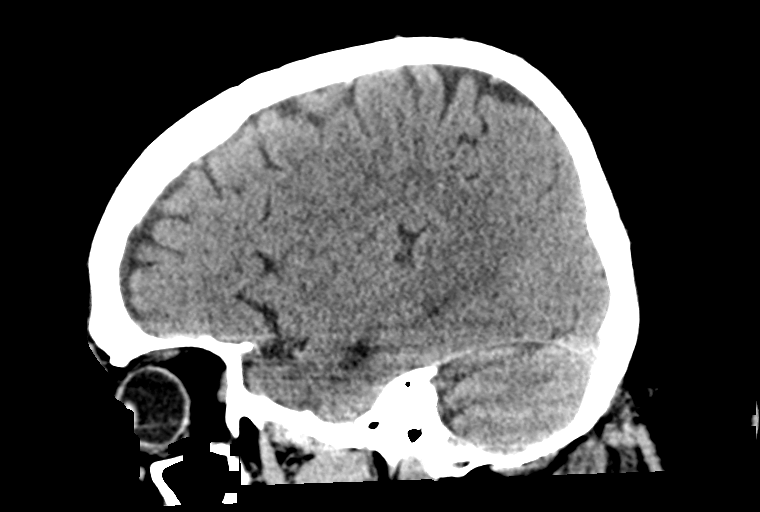
[im 32/64  brain]
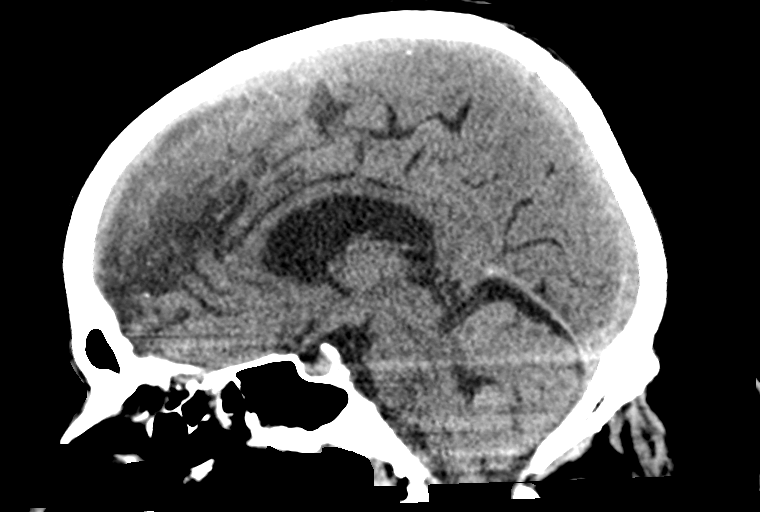
[im 43/64  brain]
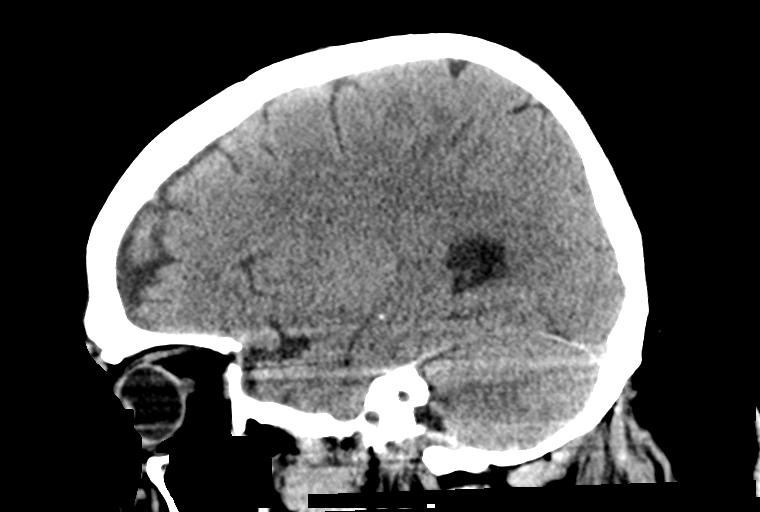

[15 of 47 positions shown; findings below may reference images not displayed]

FINDINGS: Brain: Ventricles, cisterns and other CSF spaces are within normal.
There is no mass, mass effect, shift of midline structures or acute
hemorrhage. No evidence of acute infarction.

Vascular: No hyperdense vessel or unexpected calcification.

Skull: Normal. Negative for fracture or focal lesion.

Sinuses/Orbits: Orbits are normal. Paranasal sinuses are well
developed with minimal mucosal membrane thickening involving the
right maxillary sinus. Mastoid air cells are clear.

Other: None.
IMPRESSION: No acute findings.

## 2019-01-22 IMAGING — DX DG KNEE COMPLETE 4+V*R*
4 series · 4 of 4 positions shown · non-contrast
Comparison: Right knee series of November 07, 2006.

CLINICAL DATA: Diffuse right knee pain after falling last night.

EXAM:
RIGHT KNEE - COMPLETE 4+ VIEW

[knee ap]
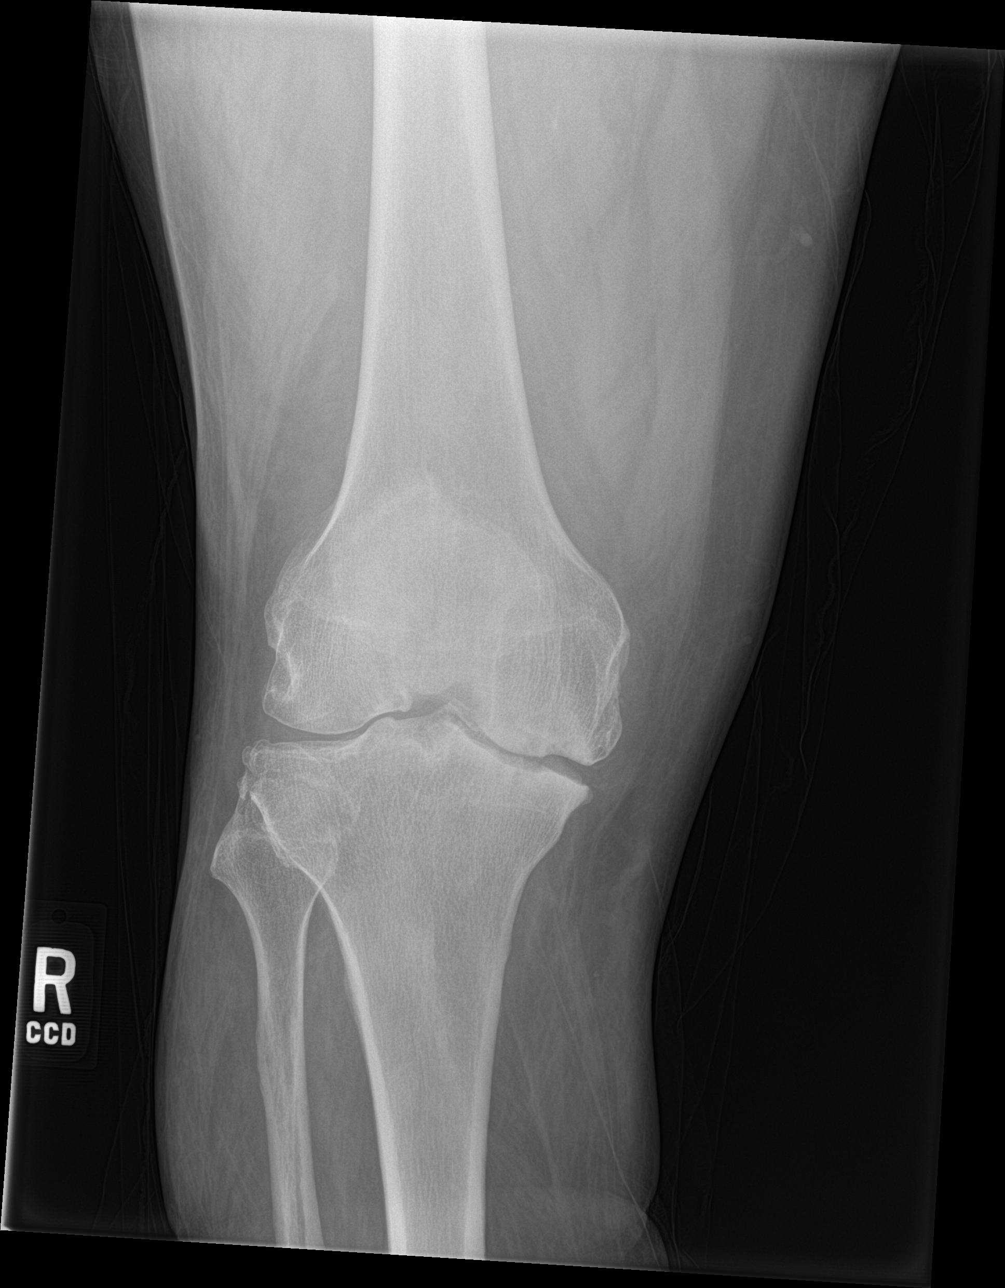

[knee lat]
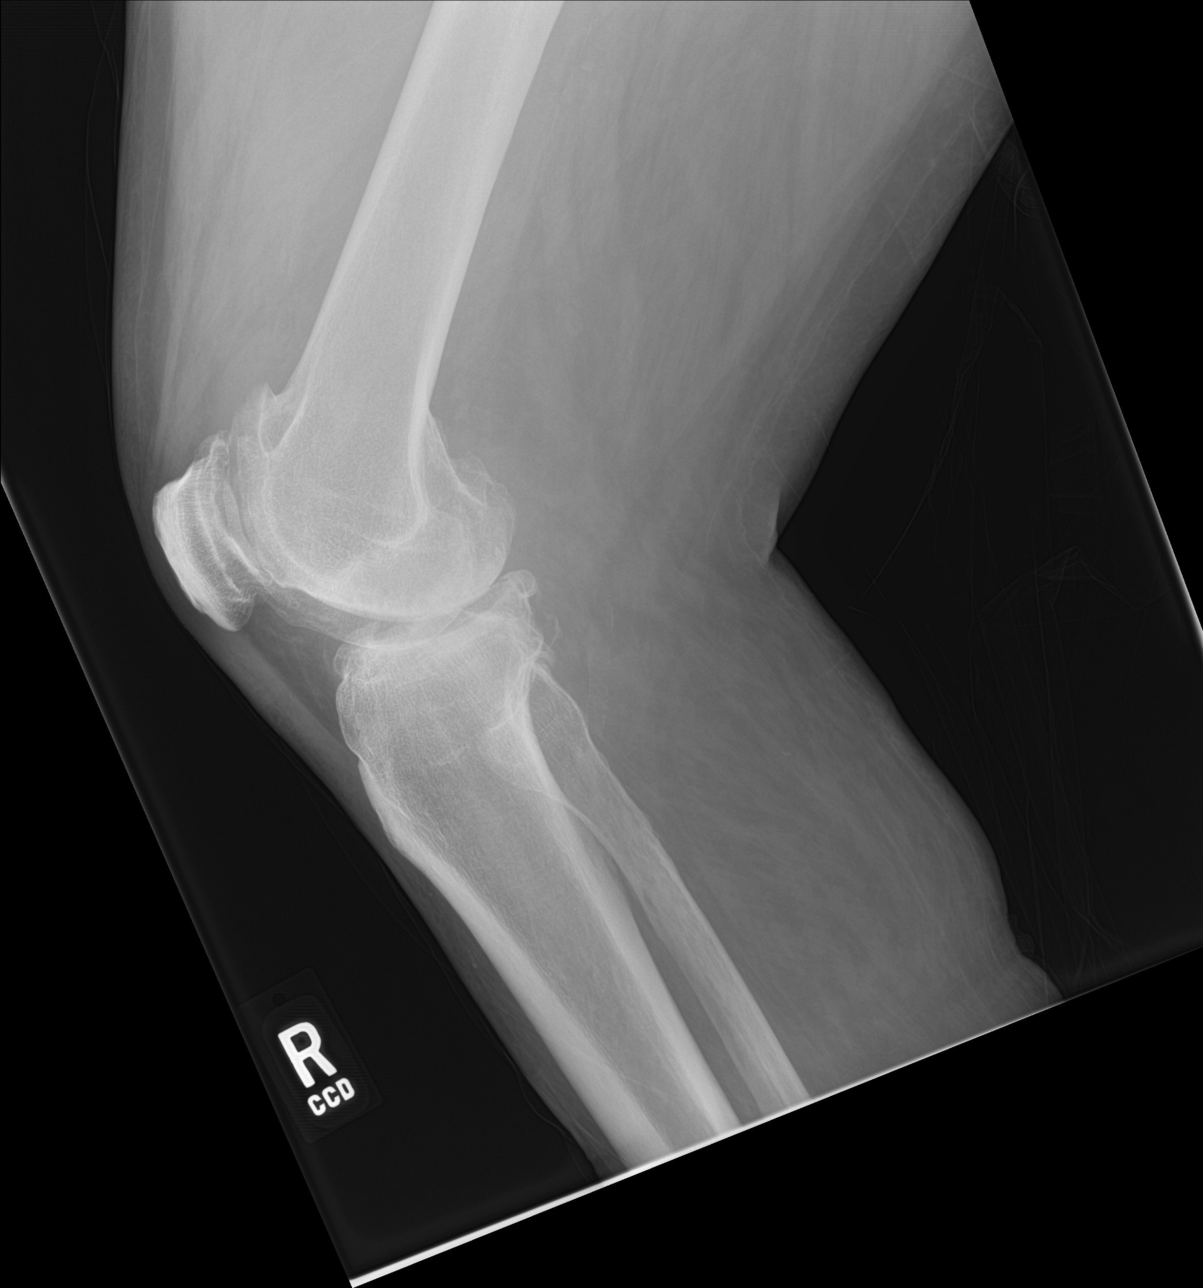

[knee obl (1 of 2)]
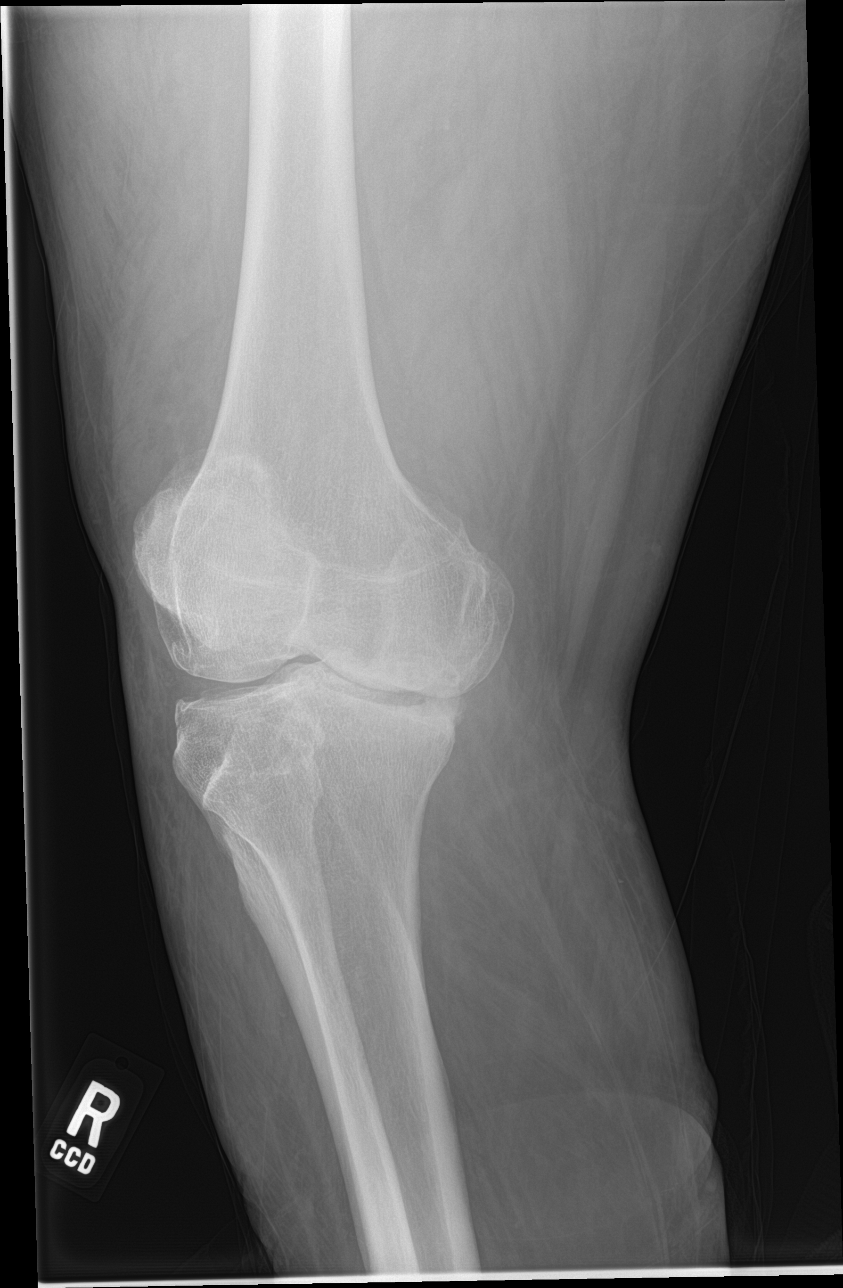

[knee obl (2 of 2)]
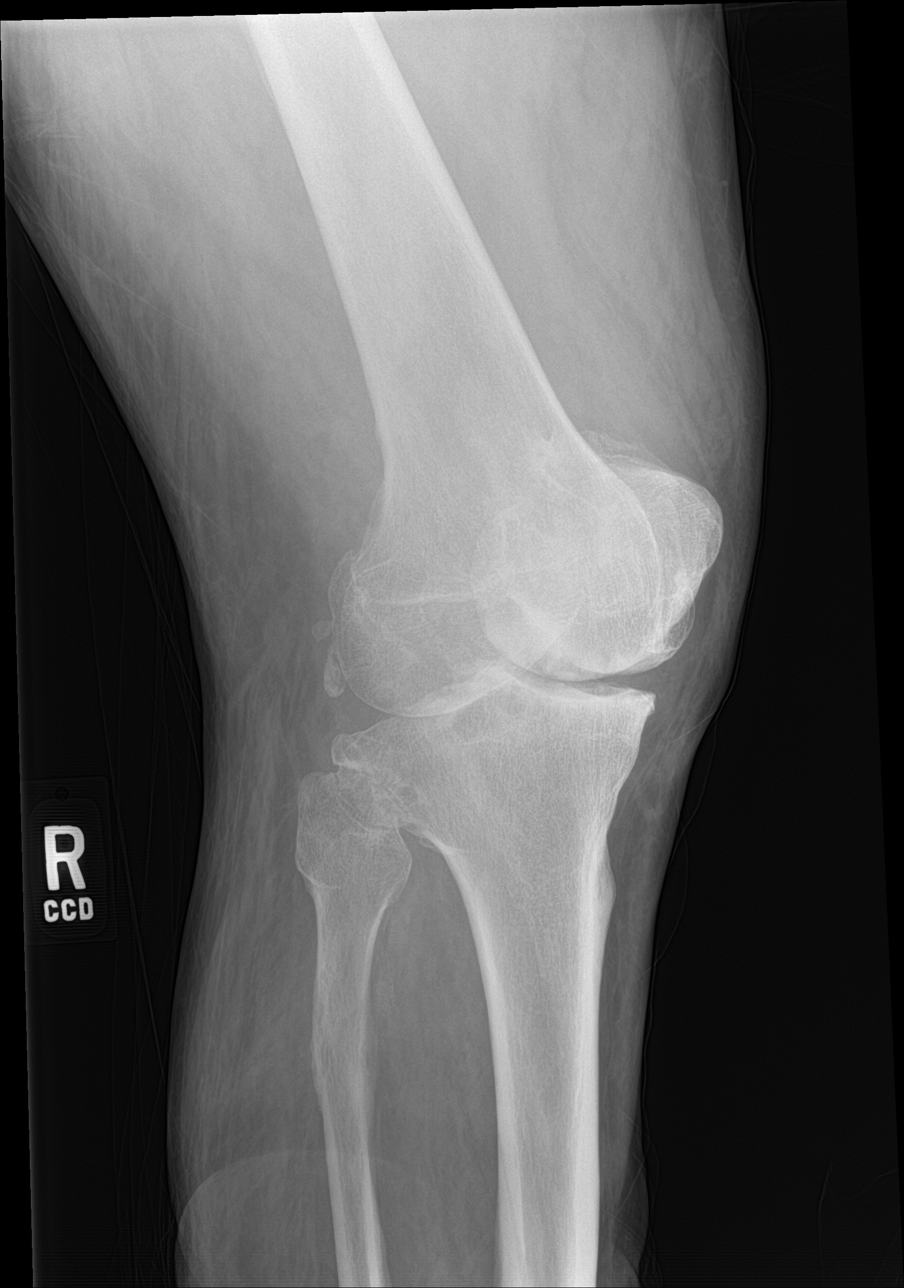

[4 of 4 positions shown; findings below may reference images not displayed]

FINDINGS: The bones are subjectively adequately mineralized. There is moderate
narrowing of the lateral compartment and moderate to severe
narrowing of the medial compartment. There is irregularity of the
articular surface of the medial femoral condyle and medial tibial
plateau. No definite acute fracture is observed. There is old
deformity of the shaft of the proximal fibula. There is degenerative
spurring of the articular margins of the patella and adjacent femur.
There is a small suprapatellar effusion.
IMPRESSION: Moderate osteoarthritic change centered on the lateral and
patellofemoral compartments with moderate to severe degenerative
changes of the medial compartment. There is a probable osteochondral
defect in the periphery of the Lat medial femoral condyle. There is
some mild flattening of the medial tibial plateau which is not
clearly acute.

## 2019-04-30 ENCOUNTER — Encounter (HOSPITAL_COMMUNITY): Payer: Self-pay

## 2020-05-05 ENCOUNTER — Encounter (HOSPITAL_COMMUNITY): Payer: Self-pay

## 2021-02-24 ENCOUNTER — Other Ambulatory Visit: Payer: Self-pay

## 2021-02-24 ENCOUNTER — Ambulatory Visit (INDEPENDENT_AMBULATORY_CARE_PROVIDER_SITE_OTHER): Payer: No Typology Code available for payment source | Admitting: Pulmonary Disease

## 2021-02-24 ENCOUNTER — Encounter: Payer: Self-pay | Admitting: Pulmonary Disease

## 2021-02-24 VITALS — BP 166/102 | HR 69 | Temp 98.2°F | Ht 66.0 in

## 2021-02-24 DIAGNOSIS — G7000 Myasthenia gravis without (acute) exacerbation: Secondary | ICD-10-CM

## 2021-02-24 DIAGNOSIS — T17908D Unspecified foreign body in respiratory tract, part unspecified causing other injury, subsequent encounter: Secondary | ICD-10-CM

## 2021-02-24 DIAGNOSIS — D849 Immunodeficiency, unspecified: Secondary | ICD-10-CM

## 2021-02-24 DIAGNOSIS — R918 Other nonspecific abnormal finding of lung field: Secondary | ICD-10-CM | POA: Diagnosis not present

## 2021-02-24 NOTE — Patient Instructions (Signed)
Thank you for visiting Dr. Tonia Brooms at [facility]. Today we recommend the following:  Orders Placed This Encounter  Procedures   CT CHEST HIGH RESOLUTION   Return in about 2 weeks (around 03/10/2021) for with APP or Dr. Tonia Brooms. Televisit to review CT results.     Please do your part to reduce the spread of COVID-19.

## 2021-02-24 NOTE — Progress Notes (Signed)
Synopsis: Referred in September 2022 for lung nodules by [facility]  Subjective:   PATIENT ID: Anthony Carlson GENDER: male DOB: 1960/10/08, MRN: [id number]  Chief Complaint  Patient presents with   Consult    Pt states following up after CT.     This is a 60 year old gentleman, past medical history of hypertension, myasthenia gravis, OSA on CPAP.  Patient had CT scan of the chest that was completed at the [facility] hospital.  This revealed multifocal areas of nodularity and opacities associated with groundglass.  He has multiple pulmonary nodules present.  Representing ongoing inflammation or atypical infection.  Patient was referred for evaluation.  I have a CT scan report that was faxed to our office.  He has significant amounts of SOB for the past several weeks. He noticed lot so water brash sensation. He did have episodes of aspiration. Dx with pneumonia. Treated with augmentin. He went to the [facility] walk-in clinic. They sent him for CTA was neg for PE but was found to have innumerable pulmonary nodules. 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 Quit smoking in 1988, smoked for 12 - 14 years, smoking 2.5 ppd at max.   He has MG, on 3g per day of Cellcept + pyridosigmine    Past Medical History:  Diagnosis Date   Arthritis    Chronic fatigue    Depression    GERD (gastroesophageal reflux disease)    Gout    Hypertension    IBS (irritable bowel syndrome)    Memory changes    Mental disorder    Myasthenia gravis (HCC) 02/2015   OSA on CPAP      Family History  Problem Relation Age of Onset   Alcoholism Mother    Drug abuse Sister    Drug abuse Sister    Severe combined immunodeficiency Maternal Uncle    Suicidality Maternal Uncle    COPD Other    Diabetes Other    Cancer Other    Hypertension Other      Past Surgical History:  Procedure Laterality Date   ankle fusions     x 2    CARPAL TUNNEL RELEASE Left    2018   LAPAROSCOPIC GASTRIC SLEEVE  RESECTION N/A 10/14/2017   Procedure: LAPAROSCOPIC GASTRIC SLEEVE RESECTION, UPPER ENDOSCOPY;  Surgeon: Glenna Fellows, MD;  Location: [facility];  Service: General;  Laterality: N/A;   THULIUM LASER TURP (TRANSURETHRAL RESECTION OF PROSTATE)  11/02/2016   vasectomy and reversal      Social History   Socioeconomic History   Marital status: Married    Spouse name: Not on file   Number of children: Not on file   Years of education: Not on file   Highest education level: Not on file  Occupational History   Occupation: PA  Tobacco Use   Smoking status: Former    Packs/day: 2.00    Years: 14.00    Pack years: 28.00    Types: Cigarettes    Quit date: 1988    Years since quitting: 34.6   Smokeless tobacco: Never  Vaping Use   Vaping Use: Never used  Substance and Sexual Activity   Alcohol use: No    Comment: Stop drinking 1987   Drug use: Not Currently    Comment: 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   Has 2 children.  Works as a Transport planner.  Education: college.   Social Determinants of Health   Financial Resource Strain: Not on file  Food Insecurity: Not on file  Transportation Needs: Not on file  Physical Activity: Not on file  Stress: Not on file  Social Connections: Not on file  Intimate Partner Violence: Not on file     Allergies  Allergen Reactions   Avelox [Moxifloxacin Hcl In Nacl] Anaphylaxis   Pyridostigmine Diarrhea and Nausea Only   Hydromorphone Other (See Comments)    Agitation      Outpatient Medications Prior to Visit  Medication Sig Dispense Refill   acetaminophen (TYLENOL) 500 MG tablet Take 1,000 mg by mouth 2 (two) times daily as needed for moderate pain or headache.      Armodafinil 250 MG tablet TAKE ONE TABLET BY MOUTH ONCE A DAY CHANGED FROM MODAFINIL 200 MG TWICE DAILY     atorvastatin (LIPITOR) 40 MG tablet  Take 40 mg by mouth at bedtime.   2   Azelastine-Fluticasone 137-50 MCG/ACT SUSP Place 2 sprays into the nose 2 (two) times daily as needed (allergies).     benzoyl peroxide 10 % gel Apply 1 application topically daily as needed (acne).     Cholecalciferol (VITAMIN D3) 5000 units CAPS Take 1 capsule by mouth daily.     clindamycin (CLEOCIN T) 1 % external solution Apply 1 application topically daily as needed (acne).   3   diclofenac sodium (VOLTAREN) 1 % GEL Apply 1 application topically 4 (four) times daily as needed for pain.  3   doxazosin (CARDURA) 2 MG tablet Take 2 mg by mouth daily.     hyoscyamine (LEVSIN SL) 0.125 MG SL tablet Place 0.125 mg under the tongue 3 (three) times daily as needed (bladder spasms and GI).      Metoprolol Succinate 25 MG CS24 Take by mouth.     Multiple Vitamin (MULTIVITAMIN WITH MINERALS) TABS tablet Take 1 tablet by mouth daily.     mycophenolate (CELLCEPT) 500 MG tablet Take 1,000 mg by mouth 2 (two) times daily.      omeprazole (PRILOSEC) 40 MG capsule Take 40 mg by mouth 2 (two) times daily.     protein supplement shake (PREMIER PROTEIN) LIQD Take 325 mLs (11 oz total) by mouth 4 (four) times daily. 10 Can 0   tadalafil (CIALIS) 20 MG tablet Take 20 mg by mouth daily as needed for erectile dysfunction.     testosterone cypionate (DEPOTESTOSTERONE CYPIONATE) 200 MG/ML injection Inject 200 mg into the muscle every 14 (fourteen) days.      ULORIC 40 MG tablet Take 40 mg by mouth daily.  2   celecoxib (CELEBREX) 200 MG capsule Take 1 capsule (200 mg total) by mouth daily. (Patient not taking: Reported on 02/24/2021) 30 capsule 0   gabapentin (NEURONTIN) 600 MG tablet Take 600 mg by mouth 3 (three) times daily. (Patient not taking: Reported on 02/24/2021)     hydrochlorothiazide (MICROZIDE) 12.5 MG capsule Take 1 capsule (12.5 mg total) by mouth daily. (Patient not taking: Reported on 02/24/2021) 30 capsule 0   lisinopril (PRINIVIL,ZESTRIL) 10 MG tablet Take 1 tablet  (10 mg total) by mouth daily. (Patient not taking: Reported on 02/24/2021) 30 tablet 0   metoprolol (LOPRESSOR) 50 MG tablet Take 50 mg by mouth 2 (two) times daily.  (Patient not taking: Reported on 02/24/2021)  1   modafinil (PROVIGIL) 200 MG  tablet Take 200 mg by mouth daily. (Patient not taking: Reported on 02/24/2021)     No facility-administered medications prior to visit.    Review of Systems  Constitutional:  Negative for chills, fever, malaise/fatigue and weight loss.  HENT:  Negative for hearing loss, sore throat and tinnitus.   Eyes:  Negative for blurred vision and double vision.  Respiratory:  Positive for cough. Negative for hemoptysis, sputum production, shortness of breath, wheezing and stridor.   Cardiovascular:  Negative for chest pain, palpitations, orthopnea, leg swelling and PND.  Gastrointestinal:  Negative for abdominal pain, constipation, diarrhea, heartburn, nausea and vomiting.       Reflux   Genitourinary:  Negative for dysuria, hematuria and urgency.  Musculoskeletal:  Negative for joint pain and myalgias.  Skin:  Negative for itching and rash.  Neurological:  Negative for dizziness, tingling, weakness and headaches.  Endo/Heme/Allergies:  Negative for environmental allergies. Does not bruise/bleed easily.  Psychiatric/Behavioral:  Negative for depression. The patient is not nervous/anxious and does not have insomnia.   All other systems reviewed and are negative.   Objective:  Physical Exam Vitals reviewed.  Constitutional:      General: He is not in acute distress.    Appearance: He is well-developed. He is obese.  HENT:     Head: Normocephalic and atraumatic.  Eyes:     General: No scleral icterus.    Conjunctiva/sclera: Conjunctivae normal.     Pupils: Pupils are equal, round, and reactive to light.  Neck:     Vascular: No JVD.     Trachea: No tracheal deviation.  Cardiovascular:     Rate and Rhythm: Normal rate and regular rhythm.     Heart sounds:  Normal heart sounds. No murmur heard. Pulmonary:     Effort: Pulmonary effort is normal. No tachypnea, accessory muscle usage or respiratory distress.     Breath sounds: Normal breath sounds. No stridor. No wheezing, rhonchi or rales.  Abdominal:     General: Bowel sounds are normal. There is no distension.     Palpations: Abdomen is soft.     Tenderness: There is no abdominal tenderness.  Musculoskeletal:        General: No tenderness.     Cervical back: Neck supple.     Right lower leg: Edema present.     Left lower leg: Edema present.  Lymphadenopathy:     Cervical: No cervical adenopathy.  Skin:    General: Skin is warm and dry.     Capillary Refill: Capillary refill takes less than 2 seconds.     Findings: No rash.  Neurological:     Mental Status: He is alert and oriented to person, place, and time.  Psychiatric:        Behavior: Behavior normal.     Vitals:   02/24/21 1612  BP: (!) 166/102  Pulse: 69  Temp: 98.2 F (36.8 C)  TempSrc: Oral  SpO2: 100%  Height: 5\' 6"  (1.676 m)   100% on  RA BMI Readings from Last 3 Encounters:  02/24/21 34.98 kg/m  04/02/18 34.45 kg/m  12/04/17 40.74 kg/m   Wt Readings from Last 3 Encounters:  04/02/18 216 lb 11.2 oz (98.3 kg)  12/04/17 260 lb 1.6 oz (118 kg)  10/25/17 268 lb 8 oz (121.8 kg)     CBC    Component Value Date/Time   WBC 8.1 10/21/2017 0254   RBC 4.73 10/21/2017 0254   HGB 12.4 (L) 10/21/2017 0254   HCT  39.4 10/21/2017 0254   PLT 143 (L) 10/21/2017 0254   MCV 83.3 10/21/2017 0254   MCH 26.2 10/21/2017 0254   MCHC 31.5 10/21/2017 0254   RDW 15.5 10/21/2017 0254   LYMPHSABS 1.0 10/20/2017 1549   MONOABS 0.8 10/20/2017 1549   EOSABS 0.2 10/20/2017 1549   BASOSABS 0.0 10/20/2017 1549     Chest Imaging: 01/10/2021: CT chest December 07, 2020 Patient has bilateral peribronchovascular infiltrates, worse on the left compared to the right, scattered innumerable small subcentimeter pulmonary nodules. The  patient's images have been independently reviewed by me.     Pulmonary Functions Testing Results: No flowsheet data found.  FeNO:   Pathology:   Echocardiogram:   Heart Catheterization:     Assessment & Plan:     ICD-10-CM   1. Multiple pulmonary nodules  R91.8 CT CHEST HIGH RESOLUTION    2. Myasthenia gravis (HCC)  G70.00     3. Immunosuppressed status (HCC)  D84.9     4. Aspiration into airway, subsequent encounter  T17.908D       Discussion:  60 year old gentleman, history of myasthenia gravis, immune suppressed.  Recent episodes of known aspiration into the airway with significant water brash.  Changed his bed to have elevation.  Had CT imaging of the chest which found incidental left-sided predominant peribronchovascular groundglass infiltrates with multiple pulmonary nodules.  Plan: He is immune suppressed but has no significant infectious symptoms right now. I think putting him to sleep would be high risk due to his myasthenia gravis. If he has any significant change in his CT scan over the nodules progress then I think we could consider bronchoscopy. We want to do this moderately sedated without using any type of paralysis if at all possible. From respiratory standpoint he is stable. I think we should follow his nodules and changes conservatively. I think we may be dealing with some leftover remnants from an aspiration event on his previous CT. Patient is agreeable to this plan.  We will set up for a televisit to discuss results in a few weeks.   Current Outpatient Medications:    acetaminophen (TYLENOL) 500 MG tablet, Take 1,000 mg by mouth 2 (two) times daily as needed for moderate pain or headache. , Disp: , Rfl:    Armodafinil 250 MG tablet, TAKE ONE TABLET BY MOUTH ONCE A DAY CHANGED FROM MODAFINIL 200 MG TWICE DAILY, Disp: , Rfl:    atorvastatin (LIPITOR) 40 MG tablet, Take 40 mg by mouth at bedtime. , Disp: , Rfl: 2   Azelastine-Fluticasone 137-50 MCG/ACT  SUSP, Place 2 sprays into the nose 2 (two) times daily as needed (allergies)., Disp: , Rfl:    benzoyl peroxide 10 % gel, Apply 1 application topically daily as needed (acne)., Disp: , Rfl:    Cholecalciferol (VITAMIN D3) 5000 units CAPS, Take 1 capsule by mouth daily., Disp: , Rfl:    clindamycin (CLEOCIN T) 1 % external solution, Apply 1 application topically daily as needed (acne). , Disp: , Rfl: 3   diclofenac sodium (VOLTAREN) 1 % GEL, Apply 1 application topically 4 (four) times daily as needed for pain., Disp: , Rfl: 3   doxazosin (CARDURA) 2 MG tablet, Take 2 mg by mouth daily., Disp: , Rfl:    hyoscyamine (LEVSIN SL) 0.125 MG SL tablet, Place 0.125 mg under the tongue 3 (three) times daily as needed (bladder spasms and GI). , Disp: , Rfl:    Metoprolol Succinate 25 MG CS24, Take by mouth., Disp: , Rfl:  Multiple Vitamin (MULTIVITAMIN WITH MINERALS) TABS tablet, Take 1 tablet by mouth daily., Disp: , Rfl:    mycophenolate (CELLCEPT) 500 MG tablet, Take 1,000 mg by mouth 2 (two) times daily. , Disp: , Rfl:    omeprazole (PRILOSEC) 40 MG capsule, Take 40 mg by mouth 2 (two) times daily., Disp: , Rfl:    protein supplement shake (PREMIER PROTEIN) LIQD, Take 325 mLs (11 oz total) by mouth 4 (four) times daily., Disp: 10 Can, Rfl: 0   tadalafil (CIALIS) 20 MG tablet, Take 20 mg by mouth daily as needed for erectile dysfunction., Disp: , Rfl:    testosterone cypionate (DEPOTESTOSTERONE CYPIONATE) 200 MG/ML injection, Inject 200 mg into the muscle every 14 (fourteen) days. , Disp: , Rfl:    ULORIC 40 MG tablet, Take 40 mg by mouth daily., Disp: , Rfl: 2  I spent 46 minutes dedicated to the care of this patient on the date of this encounter to include pre-visit review of records, face-to-face time with the patient discussing conditions above, post visit ordering of testing, clinical documentation with the electronic health record, making appropriate referrals as documented, and communicating  necessary findings to members of the patients care team.   Josephine IgoBradley L Lyberti Thrush, DO [facility] 02/24/2021 4:19 PM

## 2021-02-28 ENCOUNTER — Telehealth: Payer: Self-pay | Admitting: Pulmonary Disease

## 2021-02-28 MED ORDER — AMOXICILLIN-POT CLAVULANATE 875-125 MG PO TABS: 1.0000 | ORAL_TABLET | Freq: Two times a day (BID) | ORAL | 0 refills | Status: DC

## 2021-02-28 MED ORDER — ALBUTEROL SULFATE HFA 108 (90 BASE) MCG/ACT IN AERS: 2.0000 | INHALATION_SPRAY | Freq: Four times a day (QID) | RESPIRATORY_TRACT | 6 refills | Status: DC | PRN

## 2021-02-28 NOTE — Telephone Encounter (Signed)
Spoke with the pt  He states that Saturday on 02/25/21 he aspirated some water while lying down that night  The head of his bed is supposed to elevate, but it was not working at the time  He had a lot of coughing that night and into the next day  02/26/21 he had a lot of SOB, cough with yellow sputum and wheezing  He states he thinks he had fever but did not check his temp- he did have sweats and then chills  He slept a lot over the weekend and spouse reports that 02/26/21 his o2 sats got down to 83%ra  She states that he seemed to be confused and out of it  He used his son's albuterol inhaler and he stated it helped a lot  Feeling much better today, still has some cough with yellow sputum   His repeat ct chest is scheduled for 03/11/21  He is asking if okay to wait until then and if needs anything called in  Please advise thanks

## 2021-02-28 NOTE — Telephone Encounter (Signed)
ATC, left VM. 

## 2021-02-28 NOTE — Telephone Encounter (Signed)
Called to give pt CT appt info.  Pts wife states pt is not doing good. Saturday night pt aspirated in his sleep w/ a lot of coughing and couldn't tolerate CPAP.   States Sun and Monday pt was very incoherent, clammy drowsy, and confused. States BP was 98/52 and O2 dropped down to 83%. States pt was too out of it and refused to go to the hosp.  Please advise.

## 2021-02-28 NOTE — Telephone Encounter (Signed)
  Ok to sent albuterol inhaler rx  Would send in augmentin X 7 days BID  He should probably see a gastroenterologist at some point  I am happy to refer if he likes or his PCP   Thanks   BLI   Pt aware of response per BI  Rxs were sent  He already has appt with GI pending

## 2021-03-11 ENCOUNTER — Ambulatory Visit
Admission: RE | Admit: 2021-03-11 | Discharge: 2021-03-11 | Disposition: A | Discharge status: Home / Self Care | Payer: Non-veteran care | Source: Ambulatory Visit | Attending: Pulmonary Disease | Admitting: Pulmonary Disease

## 2021-03-11 DIAGNOSIS — R918 Other nonspecific abnormal finding of lung field: Secondary | ICD-10-CM

## 2021-03-22 NOTE — Progress Notes (Signed)
You can let him know his ct looks ok. He has a small nodule that we should follow up in 12 months with repeat ct chest wo contrast. Also, his pulmonary arteries are enlarged. His previous echo looked ok regarding his right ventricle. But at somepoint should consider repeat ECHO to make sure he is not developing pulmonary hypertension.   If he would like to pursue this further. Please set up a possible pulmonary hypertension consult with Dr. Judeth Horn in our group that specializes in this. Otherwise he can continue to monitor with his PCP. But this could play a role in any breathless type symptoms he may be having.   Thanks,  BLI  Josephine Igo, DO [facility] 03/22/2021 2:55 PM

## 2021-03-23 ENCOUNTER — Encounter: Payer: Self-pay | Admitting: *Deleted

## 2021-04-13 ENCOUNTER — Ambulatory Visit (INDEPENDENT_AMBULATORY_CARE_PROVIDER_SITE_OTHER): Payer: No Typology Code available for payment source | Admitting: Pulmonary Disease

## 2021-04-13 ENCOUNTER — Other Ambulatory Visit: Payer: Self-pay

## 2021-04-13 ENCOUNTER — Encounter: Payer: Self-pay | Admitting: Pulmonary Disease

## 2021-04-13 VITALS — BP 152/92 | HR 77 | Temp 97.7°F | Ht 67.0 in | Wt 225.0 lb

## 2021-04-13 DIAGNOSIS — R0609 Other forms of dyspnea: Secondary | ICD-10-CM

## 2021-04-13 NOTE — Patient Instructions (Signed)
Nice to meet you  First we need an echocardiogram for further evaluation - I am hopeful it will look good and we will not need to worry about pulmonary hypertension.   We will decide follow up based on results

## 2021-05-02 NOTE — Progress Notes (Signed)
@Patient  ID: , male    DOB: Mar 14, 1961, 60 y.o.   MRN: [id number]  Chief Complaint  Patient presents with   Consult    Patient that he was referred for Pulmonary hypertension and to get established.     Referring provider: [id number], NP  HPI:   60 year old man whom we are seeing in consultation for evaluation of possible pulmonary hypertension.  Note from referring provider, 77 DO reviewed.    Patient was undergoing CT scan for follow-up.  Commented on slightly enlarged pulmonary arteries.  This prompted referral.  He does have dyspnea.  Worsened inclines or stairs.  Feels breathless with minimal activity.  Notably does have underlying myasthenia.  Diagnosed with aspiration in the past.  Aspiration pneumonia.  Reviewed most recent CT scan 02/2021 though my review interpretation shows mild mosaicism, small nodule that is being followed up.  Pulmonary artery appears enlarged to the radiologist.  Reviewed most recent echocardiogram in 2019 that demonstrated left atrial dilation,Grade 1 diastolic dysfunction, trivial MR, normal RV size and function, normal RA size.    PMH: Hyperlipidemia, gout Surgical history: Ankle fusion, carpal tunnel release, gastric sleeve Family history: No significant respiratory illness in first-degree relatives Social history: Former smoker, quit late 1980s, reports about 28-pack-year history prior to quitting, lives in [place] / Pulmonary Flowsheets:   ACT:  No flowsheet data found.  MMRC: No flowsheet data found.  Epworth:  No flowsheet data found.  Tests:   FENO:  No results found for: NITRICOXIDE  PFT: No flowsheet data found.  WALK:  No flowsheet data found.  Imaging: Personally reviewed and as per EMR discussion   Lab Results: Personally reviewed CBC    Component Value Date/Time   WBC 8.1 10/21/2017 0254   RBC 4.73 10/21/2017 0254   HGB 12.4 (L) 10/21/2017 0254   HCT 39.4  10/21/2017 0254   PLT 143 (L) 10/21/2017 0254   MCV 83.3 10/21/2017 0254   MCH 26.2 10/21/2017 0254   MCHC 31.5 10/21/2017 0254   RDW 15.5 10/21/2017 0254   LYMPHSABS 1.0 10/20/2017 1549   MONOABS 0.8 10/20/2017 1549   EOSABS 0.2 10/20/2017 1549   BASOSABS 0.0 10/20/2017 1549    BMET    Component Value Date/Time   NA 140 10/21/2017 0254   K 4.1 10/21/2017 0254   CL 108 10/21/2017 0254   CO2 23 10/21/2017 0254   GLUCOSE 88 10/21/2017 0254   BUN 36 (H) 10/21/2017 0254   CREATININE 1.03 10/21/2017 0254   CALCIUM 8.8 (L) 10/21/2017 0254   GFRNONAA >60 10/21/2017 0254   GFRAA >60 10/21/2017 0254    BNP No results found for: BNP  ProBNP No results found for: PROBNP  Specialty Problems   None   Allergies  Allergen Reactions   Avelox [Moxifloxacin Hcl In Nacl] Anaphylaxis   Hydromorphone Other (See Comments)    Agitation    Pyridostigmine Diarrhea and Nausea Only    Immunization History  Administered Date(s) Administered   Influenza Split 02/23/2014, 03/26/2015, 02/24/2016, 02/23/2017, 02/23/2018   Influenza-Unspecified 04/26/2007, 03/26/2008, 02/23/2009, 02/24/2019, 04/27/2019, 03/30/2020   Moderna Sars-Covid-2 Vaccination 06/17/2019, 07/22/2019, 02/24/2020   Pneumococcal Polysaccharide-23 02/03/2016   Tdap 07/26/2010   Zoster Recombinat (Shingrix) 01/29/2020, 05/06/2020    Past Medical History:  Diagnosis Date   Arthritis    Chronic fatigue    Depression    GERD (gastroesophageal reflux disease)    Gout    Hypertension    IBS (irritable bowel  syndrome)    Memory changes    Mental disorder    Myasthenia gravis ([place]) 02/2015   OSA on CPAP     Tobacco History: Social History   Tobacco Use  Smoking Status Former   Packs/day: 2.00   Years: 14.00   Pack years: 28.00   Types: Cigarettes   Quit date: 1988   Years since quitting: 34.8  Smokeless Tobacco Never   Counseling given: Not Answered   Continue to not smoke  Outpatient Encounter  Medications as of 04/13/2021  Medication Sig   acetaminophen (TYLENOL) 500 MG tablet Take 1,000 mg by mouth 2 (two) times daily as needed for moderate pain or headache.    albuterol (VENTOLIN HFA) 108 (90 Base) MCG/ACT inhaler Inhale 2 puffs into the lungs every 6 (six) hours as needed for wheezing or shortness of breath.   Armodafinil 250 MG tablet TAKE ONE TABLET BY MOUTH ONCE A DAY CHANGED FROM MODAFINIL 200 MG TWICE DAILY   atorvastatin (LIPITOR) 40 MG tablet Take 40 mg by mouth at bedtime.    Azelastine-Fluticasone 137-50 MCG/ACT SUSP Place 2 sprays into the nose 2 (two) times daily as needed (allergies).   benzoyl peroxide 10 % gel Apply 1 application topically daily as needed (acne).   Cholecalciferol (VITAMIN D3) 5000 units CAPS Take 1 capsule by mouth daily.   clindamycin (CLEOCIN T) 1 % external solution Apply 1 application topically daily as needed (acne).    diclofenac sodium (VOLTAREN) 1 % GEL Apply 1 application topically 4 (four) times daily as needed for pain.   doxazosin (CARDURA) 2 MG tablet Take 2 mg by mouth daily.   hyoscyamine (LEVSIN SL) 0.125 MG SL tablet Place 0.125 mg under the tongue 3 (three) times daily as needed (bladder spasms and GI).    Metoprolol Succinate 25 MG CS24 Take by mouth.   Multiple Vitamin (MULTIVITAMIN WITH MINERALS) TABS tablet Take 1 tablet by mouth daily.   mycophenolate (CELLCEPT) 500 MG tablet Take 1,000 mg by mouth 2 (two) times daily.    omeprazole (PRILOSEC) 40 MG capsule Take 40 mg by mouth 2 (two) times daily.   protein supplement shake (PREMIER PROTEIN) LIQD Take 325 mLs (11 oz total) by mouth 4 (four) times daily.   tadalafil (CIALIS) 20 MG tablet Take 20 mg by mouth daily as needed for erectile dysfunction.   testosterone cypionate (DEPOTESTOSTERONE CYPIONATE) 200 MG/ML injection Inject 200 mg into the muscle every 14 (fourteen) days.    ULORIC 40 MG tablet Take 40 mg by mouth daily.   [DISCONTINUED] amoxicillin-clavulanate (AUGMENTIN)  875-125 MG tablet Take 1 tablet by mouth 2 (two) times daily. (Patient not taking: Reported on 04/13/2021)   No facility-administered encounter medications on file as of 04/13/2021.     Review of Systems  Review of Systems  No chest pain with exertion.  No orthopnea or PND.  Comprehensive review of systems otherwise negative. Physical Exam  BP (!) 152/92 (BP Location: Left Arm, Patient Position: Sitting, Cuff Size: Normal)   Pulse 77   Temp 97.7 F (36.5 C) (Oral)   Ht 5\' 7"  (1.702 m)   Wt 225 lb (102.1 kg)   SpO2 98%   BMI 35.24 kg/m   Wt Readings from Last 5 Encounters:  04/13/21 225 lb (102.1 kg)  04/02/18 216 lb 11.2 oz (98.3 kg)  12/04/17 260 lb 1.6 oz (118 kg)  10/25/17 268 lb 8 oz (121.8 kg)  10/21/17 275 lb 12.8 oz (125.1 kg)    BMI  Readings from Last 5 Encounters:  04/13/21 35.24 kg/m  02/24/21 34.98 kg/m  04/02/18 34.45 kg/m  12/04/17 40.74 kg/m  10/25/17 42.05 kg/m     Physical Exam General: Sitting in chair, no acute distress Eyes: EOMI, no icterus Neck: Supple, no JVP appreciated sitting upright Pulmonary: Clear, normal work of breathing Cardiovascular: Regular rate and rhythm, no murmur Abdomen: Nondistended, bowel sounds present MSK: No synovitis, joint effusion Neuro: Normal gait, no weakness Psych: Normal mood, full affect   Assessment & Plan:   Enlarged pulmonary artery on CT scan: Prior TTE 2019 without signs of pulmonary hypertension.  Did show dilated left atrium.  If pulm hypertension is present low suspicion for Group 1 disease.  No evidence of fibrotic lung disease on CT scan.  Most likely, patient has combination of group 2 disease, possible group 3 disease in setting of undiagnosed OSA or sleep disordered breathing.  Regardless, appears to be a poor candidate for pulmonary vasodilators given likely etiology of disease.  Repeat TTE for further evaluation.  Dyspnea on exertion: Likely multifactorial related to diastolic dysfunction,  possible deconditioning, likely small airways disease given mosaicism on CT.  Consider bronchodilators in the future.  Consider PFTs in the future.   Return if symptoms worsen or fail to improve.   Lanier Clam, MD 05/02/2021

## 2021-05-03 ENCOUNTER — Ambulatory Visit (HOSPITAL_BASED_OUTPATIENT_CLINIC_OR_DEPARTMENT_OTHER)
Admission: RE | Admit: 2021-05-03 | Discharge: 2021-05-03 | Disposition: A | Discharge status: Home / Self Care | Payer: No Typology Code available for payment source | Source: Ambulatory Visit | Attending: Pulmonary Disease | Admitting: Pulmonary Disease

## 2021-05-03 ENCOUNTER — Other Ambulatory Visit: Payer: Self-pay

## 2021-05-03 DIAGNOSIS — R0609 Other forms of dyspnea: Secondary | ICD-10-CM | POA: Diagnosis present

## 2021-05-03 LAB — ECHOCARDIOGRAM COMPLETE
AR max vel: 2.48 cm2
AV Area VTI: 2.28 cm2
AV Area mean vel: 2.56 cm2
AV Mean grad: 6 mmHg
AV Peak grad: 11.6 mmHg
Ao pk vel: 1.7 m/s
Area-P 1/2: 4.04 cm2
Calc EF: 66.4 %
S' Lateral: 3.5 cm
Single Plane A2C EF: 73.9 %
Single Plane A4C EF: 58.6 %

## 2021-05-11 ENCOUNTER — Encounter (HOSPITAL_COMMUNITY): Payer: Self-pay | Admitting: *Deleted

## 2022-01-04 ENCOUNTER — Emergency Department (HOSPITAL_COMMUNITY)
Admission: EM | Admit: 2022-01-04 | Discharge: 2022-01-04 | Discharge status: Against Medical Advice | Payer: No Typology Code available for payment source | Attending: Emergency Medicine | Admitting: Emergency Medicine

## 2022-01-04 ENCOUNTER — Encounter (HOSPITAL_COMMUNITY): Payer: Self-pay | Admitting: Emergency Medicine

## 2022-01-04 DIAGNOSIS — N132 Hydronephrosis with renal and ureteral calculous obstruction: Secondary | ICD-10-CM | POA: Diagnosis not present

## 2022-01-04 DIAGNOSIS — Z5321 Procedure and treatment not carried out due to patient leaving prior to being seen by health care provider: Secondary | ICD-10-CM | POA: Diagnosis not present

## 2022-01-04 DIAGNOSIS — R1084 Generalized abdominal pain: Secondary | ICD-10-CM | POA: Diagnosis present

## 2022-01-04 LAB — URINALYSIS, ROUTINE W REFLEX MICROSCOPIC
Bilirubin Urine: NEGATIVE
Glucose, UA: NEGATIVE mg/dL
Ketones, ur: 5 mg/dL — AB
Leukocytes,Ua: NEGATIVE
Nitrite: NEGATIVE
Protein, ur: 30 mg/dL — AB
Specific Gravity, Urine: >1.046 — ABNORMAL HIGH (ref 1.005–1.030)
pH: 6 (ref 5.0–8.0)

## 2022-01-04 LAB — COMPREHENSIVE METABOLIC PANEL
ALT: 26 U/L (ref 0–44)
AST: 28 U/L (ref 15–41)
Albumin: 3.5 g/dL (ref 3.5–5.0)
Alkaline Phosphatase: 75 U/L (ref 38–126)
Anion gap: 8 (ref 5–15)
BUN: 11 mg/dL (ref 8–23)
CO2: 27 mmol/L (ref 22–32)
Calcium: 8.6 mg/dL — ABNORMAL LOW (ref 8.9–10.3)
Chloride: 109 mmol/L (ref 98–111)
Creatinine, Ser: 0.8 mg/dL (ref 0.61–1.24)
GFR, Estimated: >60 mL/min (ref >60)
Glucose, Bld: 131 mg/dL — ABNORMAL HIGH (ref 70–99)
Potassium: 3.7 mmol/L (ref 3.5–5.1)
Sodium: 144 mmol/L (ref 135–145)
Total Bilirubin: 0.4 mg/dL (ref 0.3–1.2)
Total Protein: 5.8 g/dL — ABNORMAL LOW (ref 6.5–8.1)

## 2022-01-04 LAB — CBC WITH DIFFERENTIAL/PLATELET
Abs Immature Granulocytes: 0.01 10*3/uL (ref 0.00–0.07)
Basophils Absolute: 0 10*3/uL (ref 0.0–0.1)
Basophils Relative: 1 %
Eosinophils Absolute: 0.2 10*3/uL (ref 0.0–0.5)
Eosinophils Relative: 4 %
HCT: 39.4 % (ref 39.0–52.0)
Hemoglobin: 12.3 g/dL — ABNORMAL LOW (ref 13.0–17.0)
Immature Granulocytes: 0 %
Lymphocytes Relative: 20 %
Lymphs Abs: 0.8 10*3/uL (ref 0.7–4.0)
MCH: 25.7 pg — ABNORMAL LOW (ref 26.0–34.0)
MCHC: 31.2 g/dL (ref 30.0–36.0)
MCV: 82.3 fL (ref 80.0–100.0)
Monocytes Absolute: 0.5 10*3/uL (ref 0.1–1.0)
Monocytes Relative: 12 %
Neutro Abs: 2.6 10*3/uL (ref 1.7–7.7)
Neutrophils Relative %: 63 %
Platelets: 153 10*3/uL (ref 150–400)
RBC: 4.79 MIL/uL (ref 4.22–5.81)
RDW: 15.8 % — ABNORMAL HIGH (ref 11.5–15.5)
WBC: 4.2 10*3/uL (ref 4.0–10.5)
nRBC: 0 % (ref 0.0–0.2)

## 2022-01-04 LAB — LIPASE, BLOOD: Lipase: 45 U/L (ref 11–51)

## 2022-01-04 NOTE — ED Notes (Signed)
Patient states that the wait is too long and he is leaving

## 2022-01-04 NOTE — ED Provider Triage Note (Signed)
Emergency Medicine Provider Triage Evaluation Note  Anthony Carlson , a 61 y.o. male  was evaluated in triage.  Pt complains of concerns for nephrolithiasis found on CT today.  Patient has associated dysuria, hematuria, diffuse abdominal pain.  No meds tried prior to arrival.  Denies nausea and vomiting.   Per patient chart review: Patient had a CT abdomen pelvis completed that showed nephrolithiasis and moderate hydronephrosis.  Patient was sent to the ED due to these incidental findings.  Review of Systems  Positive: As per HPI Negative:   Physical Exam  BP (!) 139/91 (BP Location: Left Arm)   Pulse 80   Temp 98 F (36.7 C) (Oral)   Resp 16   SpO2 96%  Gen:   Awake, no distress   Resp:  Normal effort  MSK:   Moves extremities without difficulty  Other:  No CVA tenderness to palpation noted bilaterally.  No abdominal tenderness to palpation  Medical Decision Making  Medically screening exam initiated at 5:04 PM.  Appropriate orders placed.  Anthony Carlson was informed that the remainder of the evaluation will be completed by another provider, this initial triage assessment does not replace that evaluation, and the importance of remaining in the ED until their evaluation is complete.  Work-up initiated   Tayt Moyers A, PA-C 01/04/22 1708

## 2022-01-04 NOTE — ED Notes (Signed)
Pt left stating it was due to wait time. 

## 2022-01-04 NOTE — ED Triage Notes (Signed)
Patient sent to ED from [facility] for evaluation of a kidney stone. Patient reports the stone was found on a CT scan at the [place] that was done to evaluate diffuse abdominal pain. Patient reports hematuria, denies nausea and vomiting.

## 2022-04-10 ENCOUNTER — Other Ambulatory Visit: Payer: Self-pay | Admitting: Orthopedic Surgery

## 2022-04-10 ENCOUNTER — Other Ambulatory Visit: Payer: Self-pay | Admitting: Family Medicine

## 2022-04-13 ENCOUNTER — Other Ambulatory Visit: Payer: Self-pay | Admitting: Orthopedic Surgery

## 2022-04-13 DIAGNOSIS — S66121D Laceration of flexor muscle, fascia and tendon of left index finger at wrist and hand level, subsequent encounter: Secondary | ICD-10-CM

## 2022-04-25 HISTORY — PX: CYSTOSCOPY WITH HOLMIUM LASER LITHOTRIPSY: SHX6639

## 2022-08-26 ENCOUNTER — Ambulatory Visit
Admission: EM | Admit: 2022-08-26 | Discharge: 2022-08-26 | Disposition: A | Discharge status: Home / Self Care | Payer: No Typology Code available for payment source | Attending: Family Medicine | Admitting: Family Medicine

## 2022-08-26 ENCOUNTER — Encounter: Payer: Self-pay | Admitting: Emergency Medicine

## 2022-08-26 DIAGNOSIS — R238 Other skin changes: Secondary | ICD-10-CM

## 2022-08-26 MED ORDER — VALACYCLOVIR HCL 1 G PO TABS: 1000.0000 mg | ORAL_TABLET | Freq: Three times a day (TID) | ORAL | 0 refills | Status: DC

## 2022-08-26 MED ORDER — METHYLPREDNISOLONE ACETATE 80 MG/ML IJ SUSP
80.0000 mg | Freq: Once | INTRAMUSCULAR | Status: AC
  Administered 2022-08-26: 80 mg via INTRAMUSCULAR

## 2022-08-26 NOTE — ED Triage Notes (Signed)
Rash to R foot Small red  area noted to  R great toe &  top of foot Pt believes it may be shingles  Woke up w./ burning sensation to right foot Pt has had Shingrex vaccine  Pt is  a retired [employer]

## 2022-08-26 NOTE — Discharge Instructions (Signed)
Take valacyclovir 3 times a day for 7 days.  This is for the viral rash If the rash should disappear sooner than 7 days, stop antiviral Up with your primary care doctor

## 2022-08-26 NOTE — ED Provider Notes (Signed)
[facility]    CSN: [id number] Arrival date & time: 08/26/22  0951      History   Chief Complaint Chief Complaint  Patient presents with   Rash    HPI Anthony Carlson is a 62 y.o. male.   HPI  Patient states he woke up with a burning sensation to his right big toe.  When he looked at it there was a cluster of vesicles on the top of the toe.  He has had the Shingrix vaccine.  He is concerned about possible shingles.  He states that he is immunocompromise because of longstanding myasthenia gravis Because he is a retired [employer]or, [employer] he has an idea of the treatment that he feels would be best.  He requested a gram of valacyclovir 3 times daily and a shot of steroid.  Past Medical History:  Diagnosis Date   Arthritis    Chronic fatigue    Depression    GERD (gastroesophageal reflux disease)    Gout    Hypertension    IBS (irritable bowel syndrome)    Memory changes    Mental disorder    Myasthenia gravis ([place]) 02/2015   OSA on CPAP     Patient Active Problem List   Diagnosis Date Noted   Syncope 10/20/2017   ARF (acute renal failure) ([place]) 10/20/2017   BPH (benign prostatic hyperplasia) 10/20/2017   Hypertension 10/20/2017   Hyperlipidemia 10/20/2017   Morbid obesity ([place]) 10/14/2017   Ptosis of eyelid 02/24/2016   Spinal stenosis, lumbar region, 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 Authorizing Provider  amLODipine (NORVASC) 5 MG tablet TAKE ONE TABLET BY MOUTH DAILY FOR BLOOD PRESSURE 05/25/22  Yes [provider]  famotidine (PEPCID) 40 MG tablet Take by mouth. 02/15/21  Yes [provider]  pregabalin (LYRICA) 200 MG capsule TAKE ONE CAPSULE BY MOUTH TWICE A DAY FOR NEUROPATHIC PAIN, REPLACES GABAPENTIN - INCREASED FROM '100MG'$  TWICE A DAY 06/07/22  Yes [provider]  sucralfate (CARAFATE) 1 g tablet Take 1 tablet by mouth at bedtime. 02/17/21  Yes [provider]  tamsulosin (FLOMAX) 0.4 MG CAPS capsule Take 1 capsule by mouth every evening. 01/02/21  Yes [provider]  triamcinolone cream (KENALOG) 0.1 % APPLY SMALL AMOUNT TO AFFECTED AREA TWICE A DAY AS NEEDED 11/02/21  Yes [provider]  valACYclovir (VALTREX) 1000 MG tablet Take 1 tablet (1,000 mg total) by mouth 3 (three) times daily. 08/26/22  Yes Raylene Everts, MD  acetaminophen (TYLENOL) 500 MG tablet Take 1,000 mg by mouth 2 (two) times daily as needed for moderate pain or headache.     [provider]  albuterol (VENTOLIN HFA) 108 (90 Base) MCG/ACT inhaler Inhale 2 puffs into the lungs every 6 (six) hours as needed for wheezing or shortness of breath. 02/28/21   Icard,  Bradley L, DO  Armodafinil 250 MG tablet TAKE ONE TABLET BY MOUTH ONCE A DAY CHANGED FROM MODAFINIL 200 MG TWICE DAILY 02/20/21   [provider]  atorvastatin (LIPITOR) 40 MG tablet Take 40 mg by mouth at bedtime.  03/13/16   [provider]  Azelastine-Fluticasone 137-50 MCG/ACT SUSP Place 2 sprays into the nose 2 (two) times daily as needed (allergies).    [provider]  benzoyl peroxide 10 % gel Apply 1 application topically daily as needed (acne).    [provider]  Cholecalciferol (VITAMIN D3) 5000 units CAPS Take 1 capsule by mouth daily.    [provider]  clindamycin (CLEOCIN T) 1 % external solution Apply  1 application topically daily as needed (acne).  09/23/17   [provider]  diclofenac sodium (VOLTAREN) 1 % GEL Apply 1 application topically 4 (four) times daily as needed for pain. 09/24/17   [provider]  doxazosin (CARDURA) 2 MG tablet Take 2 mg by mouth daily.    [provider]  hyoscyamine (LEVSIN SL) 0.125 MG SL tablet Place 0.125 mg under the tongue 3 (three) times daily as needed (bladder spasms and GI).     [provider]  Multiple Vitamin (MULTIVITAMIN WITH MINERALS) TABS tablet Take 1 tablet by mouth daily.    [provider]  omeprazole (PRILOSEC) 40 MG capsule Take 40 mg by mouth 2 (two) times daily.    [provider]  protein supplement shake (PREMIER PROTEIN) LIQD Take 325 mLs (11 oz total) by mouth 4 (four) times daily. 10/21/17   Raiford Noble Latif, DO  tadalafil (CIALIS) 20 MG tablet Take 20 mg by mouth daily as needed for erectile dysfunction.    [provider]  testosterone cypionate (DEPOTESTOSTERONE CYPIONATE) 200 MG/ML injection Inject 200 mg into the muscle every 14 (fourteen) days.     [provider]  ULORIC 40 MG tablet Take 40 mg by mouth daily. 08/08/17   [provider]    Family History Family History  Problem Relation Age of Onset   Alcoholism Mother    Drug abuse Sister    Drug abuse Sister    Severe combined immunodeficiency Maternal Uncle    Suicidality Maternal Uncle    COPD Other    Diabetes Other    Cancer Other    Hypertension Other     Social History Social History   Tobacco Use   Smoking status: Former    Packs/day: 2.00    Years: 14.00    Total pack years: 28.00    Types: Cigarettes    Quit date: 1988    Years since quitting: 36.1   Smokeless tobacco: Never  Vaping Use   Vaping Use: Never used  Substance Use Topics   Alcohol use: No    Comment: Stop drinking 1987   Drug use: Not Currently    Comment: 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 --      Excl. in [place]? --    No data found.  Updated Vital Signs BP (!) 147/87 (BP Location: Left Arm)   Pulse 67   Temp 99 F (37.2 C) (Oral)   Resp 16   Ht '5\' 7"'$  (1.702 m)   Wt 120.2 kg   SpO2 98%   BMI 41.50 kg/m       Physical Exam Constitutional:      General: He is not in acute distress.    Appearance: He is well-developed. He is ill-appearing.     Comments: Chronically ill.  In wheelchair  HENT:     Head: Normocephalic and atraumatic.  Eyes:     Conjunctiva/sclera: Conjunctivae normal.     Pupils: Pupils are equal, round, and reactive to light.  Cardiovascular:     Rate and Rhythm: Normal rate.  Pulmonary:     Effort: Pulmonary effort is normal. No respiratory distress.  Abdominal:     General: There is no distension.     Palpations: Abdomen is soft.  Musculoskeletal:        General: Normal range of motion.     Cervical back: Normal range of motion.  Skin:    General: Skin is warm and dry.     Findings: Lesion present.     Comments:  on the dorsum of the great toe, right foot, there is a cluster of 3 vesicles on erythematous base.    Neurological:     Mental Status: He is alert.      UC Treatments / Results  Labs (all labs ordered are listed, but only abnormal results are displayed) Labs Reviewed - No data to display  EKG   Radiology No results  found.  Procedures Procedures (including critical care time)  Medications Ordered in UC Medications  methylPREDNISolone acetate (DEPO-MEDROL) injection 80 mg (has no administration in time range)    Initial Impression / Assessment and Plan / UC Course  I have reviewed the triage vital signs and the nursing notes.  Pertinent labs & imaging results that were available during my care of the patient were reviewed by me and considered in my medical decision making (see chart for details).     This is a very small area of rash.  Patient states that it is very painful.  I have never seen shingles on the toe before.  To be up most careful, will treat with valacyclovir.  See PCP if fails to improve Final Clinical Impressions(s) / UC Diagnoses   Final diagnoses:  Vesicular rash     Discharge Instructions      Take valacyclovir 3 times a day for 7 days.  This is for the viral rash If the rash should disappear sooner than 7 days, stop antiviral Up with your primary care doctor   ED Prescriptions     Medication Sig Dispense Auth. Provider   valACYclovir (VALTREX) 1000 MG tablet Take 1 tablet (1,000 mg total) by mouth 3 (three) times daily. 21 tablet Raylene Everts, MD      PDMP not reviewed this encounter.   Raylene Everts, MD 08/26/22 [id number]

## 2022-12-04 ENCOUNTER — Encounter (HOSPITAL_COMMUNITY): Payer: Self-pay

## 2022-12-04 ENCOUNTER — Emergency Department (HOSPITAL_COMMUNITY): Payer: No Typology Code available for payment source

## 2022-12-04 ENCOUNTER — Inpatient Hospital Stay (HOSPITAL_COMMUNITY)
Admission: EM | Admit: 2022-12-04 | Discharge: 2022-12-06 | DRG: 057 | Disposition: A | Discharge status: Home / Self Care | Payer: No Typology Code available for payment source | Attending: Internal Medicine | Admitting: Internal Medicine

## 2022-12-04 ENCOUNTER — Other Ambulatory Visit: Payer: Self-pay

## 2022-12-04 DIAGNOSIS — M5417 Radiculopathy, lumbosacral region: Secondary | ICD-10-CM | POA: Diagnosis present

## 2022-12-04 DIAGNOSIS — T50B95A Adverse effect of other viral vaccines, initial encounter: Secondary | ICD-10-CM | POA: Diagnosis not present

## 2022-12-04 DIAGNOSIS — Z6372 Alcoholism and drug addiction in family: Secondary | ICD-10-CM | POA: Diagnosis not present

## 2022-12-04 DIAGNOSIS — Z79890 Hormone replacement therapy (postmenopausal): Secondary | ICD-10-CM

## 2022-12-04 DIAGNOSIS — I10 Essential (primary) hypertension: Secondary | ICD-10-CM | POA: Diagnosis not present

## 2022-12-04 DIAGNOSIS — Z8249 Family history of ischemic heart disease and other diseases of the circulatory system: Secondary | ICD-10-CM

## 2022-12-04 DIAGNOSIS — K580 Irritable bowel syndrome with diarrhea: Secondary | ICD-10-CM | POA: Diagnosis present

## 2022-12-04 DIAGNOSIS — D61818 Other pancytopenia: Secondary | ICD-10-CM | POA: Diagnosis not present

## 2022-12-04 DIAGNOSIS — Z1152 Encounter for screening for COVID-19: Secondary | ICD-10-CM

## 2022-12-04 DIAGNOSIS — G7000 Myasthenia gravis without (acute) exacerbation: Principal | ICD-10-CM | POA: Diagnosis present

## 2022-12-04 DIAGNOSIS — Z888 Allergy status to other drugs, medicaments and biological substances status: Secondary | ICD-10-CM

## 2022-12-04 DIAGNOSIS — Z825 Family history of asthma and other chronic lower respiratory diseases: Secondary | ICD-10-CM | POA: Diagnosis not present

## 2022-12-04 DIAGNOSIS — Z87891 Personal history of nicotine dependence: Secondary | ICD-10-CM | POA: Diagnosis not present

## 2022-12-04 DIAGNOSIS — K219 Gastro-esophageal reflux disease without esophagitis: Secondary | ICD-10-CM | POA: Diagnosis present

## 2022-12-04 DIAGNOSIS — R509 Fever, unspecified: Principal | ICD-10-CM

## 2022-12-04 DIAGNOSIS — L03115 Cellulitis of right lower limb: Secondary | ICD-10-CM | POA: Diagnosis present

## 2022-12-04 DIAGNOSIS — E785 Hyperlipidemia, unspecified: Secondary | ICD-10-CM | POA: Diagnosis not present

## 2022-12-04 DIAGNOSIS — R262 Difficulty in walking, not elsewhere classified: Secondary | ICD-10-CM | POA: Diagnosis present

## 2022-12-04 DIAGNOSIS — R5382 Chronic fatigue, unspecified: Secondary | ICD-10-CM | POA: Diagnosis present

## 2022-12-04 DIAGNOSIS — R5083 Postvaccination fever: Secondary | ICD-10-CM | POA: Diagnosis not present

## 2022-12-04 DIAGNOSIS — Z833 Family history of diabetes mellitus: Secondary | ICD-10-CM | POA: Diagnosis not present

## 2022-12-04 DIAGNOSIS — Z881 Allergy status to other antibiotic agents status: Secondary | ICD-10-CM

## 2022-12-04 DIAGNOSIS — Z87892 Personal history of anaphylaxis: Secondary | ICD-10-CM

## 2022-12-04 DIAGNOSIS — N400 Enlarged prostate without lower urinary tract symptoms: Secondary | ICD-10-CM | POA: Diagnosis not present

## 2022-12-04 DIAGNOSIS — Z811 Family history of alcohol abuse and dependence: Secondary | ICD-10-CM | POA: Diagnosis not present

## 2022-12-04 DIAGNOSIS — Z79899 Other long term (current) drug therapy: Secondary | ICD-10-CM

## 2022-12-04 DIAGNOSIS — Z6841 Body Mass Index (BMI) 40.0 and over, adult: Secondary | ICD-10-CM | POA: Diagnosis not present

## 2022-12-04 DIAGNOSIS — Z9079 Acquired absence of other genital organ(s): Secondary | ICD-10-CM

## 2022-12-04 DIAGNOSIS — Z885 Allergy status to narcotic agent status: Secondary | ICD-10-CM

## 2022-12-04 DIAGNOSIS — Z813 Family history of other psychoactive substance abuse and dependence: Secondary | ICD-10-CM | POA: Diagnosis not present

## 2022-12-04 LAB — CBC WITH DIFFERENTIAL/PLATELET
Abs Immature Granulocytes: 0.03 10*3/uL (ref 0.00–0.07)
Basophils Absolute: 0 10*3/uL (ref 0.0–0.1)
Basophils Relative: 0 %
Eosinophils Absolute: 0 10*3/uL (ref 0.0–0.5)
Eosinophils Relative: 0 %
HCT: 37.8 % — ABNORMAL LOW (ref 39.0–52.0)
Hemoglobin: 12.1 g/dL — ABNORMAL LOW (ref 13.0–17.0)
Immature Granulocytes: 0 %
Lymphocytes Relative: 3 %
Lymphs Abs: 0.3 10*3/uL — ABNORMAL LOW (ref 0.7–4.0)
MCH: 26.3 pg (ref 26.0–34.0)
MCHC: 32 g/dL (ref 30.0–36.0)
MCV: 82.2 fL (ref 80.0–100.0)
Monocytes Absolute: 0.8 10*3/uL (ref 0.1–1.0)
Monocytes Relative: 7 %
Neutro Abs: 9.4 10*3/uL — ABNORMAL HIGH (ref 1.7–7.7)
Neutrophils Relative %: 90 %
Platelets: 114 10*3/uL — ABNORMAL LOW (ref 150–400)
RBC: 4.6 MIL/uL (ref 4.22–5.81)
RDW: 17.8 % — ABNORMAL HIGH (ref 11.5–15.5)
WBC: 10.5 10*3/uL (ref 4.0–10.5)
nRBC: 0 % (ref 0.0–0.2)

## 2022-12-04 LAB — APTT: aPTT: 32 seconds (ref 24–36)

## 2022-12-04 LAB — URINALYSIS, W/ REFLEX TO CULTURE (INFECTION SUSPECTED)
Bacteria, UA: NONE SEEN
Bilirubin Urine: NEGATIVE
Glucose, UA: NEGATIVE mg/dL
Hgb urine dipstick: NEGATIVE
Ketones, ur: NEGATIVE mg/dL
Leukocytes,Ua: NEGATIVE
Nitrite: NEGATIVE
Protein, ur: NEGATIVE mg/dL
Specific Gravity, Urine: 1.025 (ref 1.005–1.030)
pH: 5 (ref 5.0–8.0)

## 2022-12-04 LAB — COMPREHENSIVE METABOLIC PANEL
ALT: 43 U/L (ref 0–44)
AST: 29 U/L (ref 15–41)
Albumin: 3.9 g/dL (ref 3.5–5.0)
Alkaline Phosphatase: 98 U/L (ref 38–126)
Anion gap: 9 (ref 5–15)
BUN: 19 mg/dL (ref 8–23)
CO2: 24 mmol/L (ref 22–32)
Calcium: 8.6 mg/dL — ABNORMAL LOW (ref 8.9–10.3)
Chloride: 105 mmol/L (ref 98–111)
Creatinine, Ser: 0.87 mg/dL (ref 0.61–1.24)
GFR, Estimated: >60 mL/min (ref >60)
Glucose, Bld: 117 mg/dL — ABNORMAL HIGH (ref 70–99)
Potassium: 3.6 mmol/L (ref 3.5–5.1)
Sodium: 138 mmol/L (ref 135–145)
Total Bilirubin: 0.8 mg/dL (ref 0.3–1.2)
Total Protein: 6.2 g/dL — ABNORMAL LOW (ref 6.5–8.1)

## 2022-12-04 LAB — RESP PANEL BY RT-PCR (RSV, FLU A&B, COVID)  RVPGX2
Influenza A by PCR: NEGATIVE
Influenza B by PCR: NEGATIVE
Resp Syncytial Virus by PCR: NEGATIVE
SARS Coronavirus 2 by RT PCR: NEGATIVE

## 2022-12-04 LAB — I-STAT CHEM 8, ED
BUN: 18 mg/dL (ref 8–23)
Calcium, Ion: 1.16 mmol/L (ref 1.15–1.40)
Chloride: 103 mmol/L (ref 98–111)
Creatinine, Ser: 0.9 mg/dL (ref 0.61–1.24)
Glucose, Bld: 110 mg/dL — ABNORMAL HIGH (ref 70–99)
HCT: 36 % — ABNORMAL LOW (ref 39.0–52.0)
Hemoglobin: 12.2 g/dL — ABNORMAL LOW (ref 13.0–17.0)
Potassium: 3.7 mmol/L (ref 3.5–5.1)
Sodium: 141 mmol/L (ref 135–145)
TCO2: 28 mmol/L (ref 22–32)

## 2022-12-04 LAB — PROTIME-INR
INR: 1.1 (ref 0.8–1.2)
Prothrombin Time: 14.3 seconds (ref 11.4–15.2)

## 2022-12-04 LAB — LACTIC ACID, PLASMA
Lactic Acid, Venous: 1.3 mmol/L (ref 0.5–1.9)
Lactic Acid, Venous: 1.2 mmol/L (ref 0.5–1.9)

## 2022-12-04 LAB — HIV ANTIBODY (ROUTINE TESTING W REFLEX): HIV Screen 4th Generation wRfx: NONREACTIVE

## 2022-12-04 MED ORDER — TRAZODONE HCL 50 MG PO TABS: 25.0000 mg | ORAL_TABLET | Freq: Every evening | ORAL | Status: DC | PRN

## 2022-12-04 MED ORDER — PANTOPRAZOLE SODIUM 40 MG PO TBEC
40.0000 mg | DELAYED_RELEASE_TABLET | Freq: Every day | ORAL | Status: DC
  Administered 2022-12-04 – 2022-12-06 (×3): 40 mg via ORAL
  Filled 2022-12-04 (×3): qty 1

## 2022-12-04 MED ORDER — ALBUTEROL SULFATE (2.5 MG/3ML) 0.083% IN NEBU: 2.5000 mg | INHALATION_SOLUTION | RESPIRATORY_TRACT | Status: DC | PRN

## 2022-12-04 MED ORDER — ONDANSETRON HCL 4 MG PO TABS: 4.0000 mg | ORAL_TABLET | Freq: Four times a day (QID) | ORAL | Status: DC | PRN

## 2022-12-04 MED ORDER — AMLODIPINE BESYLATE 5 MG PO TABS
5.0000 mg | ORAL_TABLET | Freq: Every day | ORAL | Status: DC
  Administered 2022-12-04 – 2022-12-06 (×3): 5 mg via ORAL
  Filled 2022-12-04 (×3): qty 1

## 2022-12-04 MED ORDER — ACETAMINOPHEN 325 MG PO TABS
650.0000 mg | ORAL_TABLET | Freq: Four times a day (QID) | ORAL | Status: DC | PRN
  Administered 2022-12-04 – 2022-12-06 (×6): 650 mg via ORAL
  Filled 2022-12-04 (×6): qty 2

## 2022-12-04 MED ORDER — CEFADROXIL 500 MG PO CAPS
500.0000 mg | ORAL_CAPSULE | Freq: Two times a day (BID) | ORAL | Status: DC
  Administered 2022-12-04 – 2022-12-05 (×2): 500 mg via ORAL
  Filled 2022-12-04 (×2): qty 1

## 2022-12-04 MED ORDER — ACETAMINOPHEN 650 MG RE SUPP: 650.0000 mg | Freq: Four times a day (QID) | RECTAL | Status: DC | PRN

## 2022-12-04 MED ORDER — SODIUM CHLORIDE 0.9 % IV BOLUS
1000.0000 mL | Freq: Once | INTRAVENOUS | Status: AC
  Administered 2022-12-04: 1000 mL via INTRAVENOUS

## 2022-12-04 MED ORDER — DICLOFENAC SODIUM 75 MG PO TBEC
75.0000 mg | DELAYED_RELEASE_TABLET | Freq: Two times a day (BID) | ORAL | Status: DC
  Administered 2022-12-04 – 2022-12-06 (×5): 75 mg via ORAL
  Filled 2022-12-04 (×7): qty 1

## 2022-12-04 MED ORDER — ONDANSETRON HCL 4 MG/2ML IJ SOLN: 4.0000 mg | Freq: Four times a day (QID) | INTRAMUSCULAR | Status: DC | PRN

## 2022-12-04 MED ORDER — ENOXAPARIN SODIUM 40 MG/0.4ML IJ SOSY
40.0000 mg | PREFILLED_SYRINGE | INTRAMUSCULAR | Status: DC
  Administered 2022-12-04 – 2022-12-05 (×2): 40 mg via SUBCUTANEOUS
  Filled 2022-12-04 (×2): qty 0.4

## 2022-12-04 NOTE — H&P (Signed)
History and Physical  Anthony Carlson VWU:[id number] DOB: [birth year] DOA: 12/04/2022  PCP: Erskine Emery, NP   Chief Complaint: Fever  HPI: Anthony Carlson is a 62 y.o. male with medical history significant for chronic fatigue, hypertension, GERD, myasthenia gravis being admitted to the hospital with possible myasthenia gravis flare.  Patient tells me that for the last 24 hours, he has been having some upper respiratory symptoms, like congestion and a runny nose.  He went and got an RSV vaccine yesterday, after which he had a fever to 101 later in the evening.  For the last 24 hours, he has also been having increased weakness.  Typically, due to his myasthenia gravis he has bilateral shoulder, right greater than left, weakness, as well as bilateral hip flexor weakness.  Most of the time, he gets around in his power wheelchair.  He also has some diplopia at baseline.  For the last 24 hours, he has noticed that his weakness has worsened, to the point that he can no longer ambulate, and he continues to have diplopia but it is more severe than usual.  ED Course: In the emergency department, he has been afebrile blood pressure has been slightly elevated.  Lab workup was pursued, and unremarkable.  He also had chest x-ray which was negative for acute findings.  Provider discussed with neurology on-call at [facility], who recommended admission to [facility] for evaluation of possible myasthenia flare, and did not recommend initiating any therapeutics at this time.  Review of Systems: Please see HPI for pertinent positives and negatives. A complete 10 system review of systems are otherwise negative.  Past Medical History:  Diagnosis Date   Arthritis    Chronic fatigue    Depression    GERD (gastroesophageal reflux disease)    Gout    Hypertension    IBS (irritable bowel syndrome)    Memory changes    Mental disorder    Myasthenia gravis (HCC) 02/2015   OSA on CPAP    Past Surgical History:   Procedure Laterality Date   ankle fusions     x 2    CARPAL TUNNEL RELEASE Left    2018   CYSTOSCOPY WITH HOLMIUM LASER LITHOTRIPSY  04/2022   LAPAROSCOPIC GASTRIC SLEEVE RESECTION N/A 10/14/2017   Procedure: LAPAROSCOPIC GASTRIC SLEEVE RESECTION, UPPER ENDOSCOPY;  Surgeon: Glenna Fellows, MD;  Location: [facility];  Service: General;  Laterality: N/A;   THULIUM LASER TURP (TRANSURETHRAL RESECTION OF PROSTATE)  11/02/2016   vasectomy and reversal      Social History:  reports that he quit smoking about 36 years ago. His smoking use included cigarettes. He has a 28.00 pack-year smoking history. He has never used smokeless tobacco. He reports that he does not currently use drugs. He reports that he does not drink alcohol.   Allergies  Allergen Reactions   Avelox [Moxifloxacin Hcl In Nacl] Anaphylaxis   Hydromorphone Other (See Comments)    Agitation    Pyridostigmine Diarrhea and Nausea Only    Family History  Problem Relation Age of Onset   Alcoholism Mother    Drug abuse Sister    Drug abuse Sister    Severe combined immunodeficiency Maternal Uncle    Suicidality Maternal Uncle    COPD Other    Diabetes Other    Cancer Other    Hypertension Other      Prior to Admission medications   Medication Sig Start Date End Date Taking? Authorizing Provider  acetaminophen (TYLENOL)  500 MG tablet Take 1,000 mg by mouth 2 (two) times daily as needed for moderate pain or headache.     [provider]  albuterol (VENTOLIN HFA) 108 (90 Base) MCG/ACT inhaler Inhale 2 puffs into the lungs every 6 (six) hours as needed for wheezing or shortness of breath. 02/28/21   Icard, Rachel Bo, DO  amLODipine (NORVASC) 5 MG tablet TAKE ONE TABLET BY MOUTH DAILY FOR BLOOD PRESSURE 05/25/22   [provider]  Armodafinil 250 MG tablet TAKE ONE TABLET BY MOUTH ONCE A DAY CHANGED FROM MODAFINIL 200 MG TWICE DAILY 02/20/21   [provider]  atorvastatin (LIPITOR) 40 MG tablet Take 40  mg by mouth at bedtime.  03/13/16   [provider]  Azelastine-Fluticasone 137-50 MCG/ACT SUSP Place 2 sprays into the nose 2 (two) times daily as needed (allergies).    [provider]  benzoyl peroxide 10 % gel Apply 1 application topically daily as needed (acne).    [provider]  Cholecalciferol (VITAMIN D3) 5000 units CAPS Take 1 capsule by mouth daily.    [provider]  clindamycin (CLEOCIN T) 1 % external solution Apply 1 application topically daily as needed (acne).  09/23/17   [provider]  diclofenac sodium (VOLTAREN) 1 % GEL Apply 1 application topically 4 (four) times daily as needed for pain. 09/24/17   [provider]  doxazosin (CARDURA) 2 MG tablet Take 2 mg by mouth daily.    [provider]  famotidine (PEPCID) 40 MG tablet Take by mouth. 02/15/21   [provider]  hyoscyamine (LEVSIN SL) 0.125 MG SL tablet Place 0.125 mg under the tongue 3 (three) times daily as needed (bladder spasms and GI).     [provider]  Multiple Vitamin (MULTIVITAMIN WITH MINERALS) TABS tablet Take 1 tablet by mouth daily.    [provider]  omeprazole (PRILOSEC) 40 MG capsule Take 40 mg by mouth 2 (two) times daily.    [provider]  pregabalin (LYRICA) 200 MG capsule TAKE ONE CAPSULE BY MOUTH TWICE A DAY FOR NEUROPATHIC PAIN, REPLACES GABAPENTIN - INCREASED FROM 100MG  TWICE A DAY 06/07/22   [provider]  protein supplement shake (PREMIER PROTEIN) LIQD Take 325 mLs (11 oz total) by mouth 4 (four) times daily. 10/21/17   Marguerita Merles Latif, DO  sucralfate (CARAFATE) 1 g tablet Take 1 tablet by mouth at bedtime. 02/17/21   [provider]  tadalafil (CIALIS) 20 MG tablet Take 20 mg by mouth daily as needed for erectile dysfunction.    [provider]  tamsulosin (FLOMAX) 0.4 MG CAPS capsule Take 1 capsule by mouth every evening. 01/02/21   [provider]   testosterone cypionate (DEPOTESTOSTERONE CYPIONATE) 200 MG/ML injection Inject 200 mg into the muscle every 14 (fourteen) days.     [provider]  triamcinolone cream (KENALOG) 0.1 % APPLY SMALL AMOUNT TO AFFECTED AREA TWICE A DAY AS NEEDED 11/02/21   [provider]  ULORIC 40 MG tablet Take 40 mg by mouth daily. 08/08/17   [provider]  valACYclovir (VALTREX) 1000 MG tablet Take 1 tablet (1,000 mg total) by mouth 3 (three) times daily. 08/26/22   Eustace Moore, MD    Physical Exam: BP (!) 141/76 (BP Location: Left Arm)   Pulse 95   Temp 100.1 F (37.8 C) (Oral)   Resp 20   SpO2 94%   General:  Alert, oriented, calm, in no acute distress, sitting up  in bed in the ER drinking coffee Eyes: EOMI, clear conjuctivae, white sclerea Neck: supple, no masses, trachea mildline  Cardiovascular: RRR, no murmurs or rubs, no peripheral edema  Respiratory: clear to auscultation bilaterally, no wheezes, no crackles  Abdomen: soft, nontender, nondistended, normal bowel tones heard  Skin: dry, no rashes  Musculoskeletal: no joint effusions, normal range of motion  Psychiatric: appropriate affect, normal speech  Neurologic: extraocular muscles intact, clear speech, moving all extremities with intact sensorium, some global weakness         Labs on Admission:  Basic Metabolic Panel: Recent Labs  Lab 12/04/22 0500 12/04/22 0527  NA 138 141  K 3.6 3.7  CL 105 103  CO2 24  --   GLUCOSE 117* 110*  BUN 19 18  CREATININE 0.87 0.90  CALCIUM 8.6*  --    Liver Function Tests: Recent Labs  Lab 12/04/22 0500  AST 29  ALT 43  ALKPHOS 98  BILITOT 0.8  PROT 6.2*  ALBUMIN 3.9   No results for input(s): "LIPASE", "AMYLASE" in the last 168 hours. No results for input(s): "AMMONIA" in the last 168 hours. CBC: Recent Labs  Lab 12/04/22 0500 12/04/22 0527  WBC 10.5  --   NEUTROABS 9.4*  --   HGB 12.1* 12.2*  HCT 37.8* 36.0*  MCV 82.2  --   PLT 114*  --     Cardiac Enzymes: No results for input(s): "CKTOTAL", "CKMB", "CKMBINDEX", "TROPONINI" in the last 168 hours.  BNP (last 3 results) No results for input(s): "BNP" in the last 8760 hours.  ProBNP (last 3 results) No results for input(s): "PROBNP" in the last 8760 hours.  CBG: No results for input(s): "GLUCAP" in the last 168 hours.  Radiological Exams on Admission: DG Chest 2 View  Result Date: 12/04/2022 CLINICAL DATA:  Fever and body aches.  Hypoxia. EXAM: CHEST - 2 VIEW COMPARISON:  Earlier same day FINDINGS: The patient has not taken a deep inspiration. Heart size is normal. Mediastinal shadows are normal. Lungs appear clear. Posterior costophrenic angles are not included on the lateral view but no pleural effusion is visible. No abnormal bone finding. IMPRESSION: Poor inspiration. No active disease suspected. Electronically Signed   By: Paulina Fusi M.D.   On: 12/04/2022 07:58   DG Chest Port 1 View  Result Date: 12/04/2022 CLINICAL DATA:  62 year old male with history of fever and urinary tract infection. Possible sepsis. EXAM: PORTABLE CHEST 1 VIEW COMPARISON:  Chest x-ray 10/20/2017. FINDINGS: Lung volumes are very low. Poorly defined bibasilar opacities may reflect areas of atelectasis and/or consolidation. No definite pleural effusions. No pneumothorax. No evidence of pulmonary edema. Heart size appears borderline enlarged. The patient is rotated to the left on today's exam, resulting in distortion of the mediastinal contours and reduced diagnostic sensitivity and specificity for mediastinal pathology. IMPRESSION: 1. Suboptimal low volume chest x-ray demonstrating bibasilar opacities which may reflect areas of atelectasis and/or consolidation. Electronically Signed   By: Trudie Reed M.D.   On: 12/04/2022 06:16    Assessment/Plan This is a pleasant 62 year old gentleman with a history of obesity, chronic fatigue, hypertension, GERD, myasthenia gravis being admitted to the  hospital with concern for possible myasthenia gravis flare.  He has worsening right greater than left weakness, and worsening diplopia.  Dyspnea gravis flare-patient is comfortable on room air, awake and alert, eating and protecting his airway -Observation admission -Anticipate formal neurology consultation, with treatment recommendations to follow  Fever-reports a fever of 101 after receiving RSV vaccine  10/6, here he is afebrile, with no leukocytosis, no further signs or symptoms of acute infection.  Possibly was vaccine reaction.  Hypertension-continue home amlodipine  GERD-continue oral PPI  DVT prophylaxis: Lovenox     Code Status: Full Code  Consults called: EDP discussed with neurology on-call  Admission status: Observation  Time spent: 52 minutes  Macey Wurtz Sharlette Dense MD [facility] Pager [phone]  If 7PM-7AM, please contact night-coverage [email or web address] Password [facility]  12/04/2022, 8:14 AM

## 2022-12-04 NOTE — ED Notes (Signed)
Called [facility] for transport to [facility]  

## 2022-12-04 NOTE — Progress Notes (Signed)
FVC 2.5L NIF -60 great effort

## 2022-12-04 NOTE — ED Notes (Signed)
Called report to [facility] RN [facility]

## 2022-12-04 NOTE — ED Triage Notes (Signed)
Pt. BIB [facility] for fever and body aches. Pt temp at home was 101.7. pt has hx of sepsis from a UTI. Pt. Is warm to touch. Pt. Has has N&D. EMS VS BP 138/76, HR 100, O2 87% RA, given 2 L O2 [place], CBG 116

## 2022-12-04 NOTE — Progress Notes (Signed)
NIF-40 FVC : 3.06 With good pt effort

## 2022-12-04 NOTE — Progress Notes (Signed)
   Informed by nursing that patient has developed possible cellulitis. Patient seen at bedside. Patient noticed today some erythema or with anterior leg. No associated tenderness. No prior history of cellulitis. Patient gets mild skin lesions from bumps and scrapes with apparent slow wound healing.  General exam: Appears calm and comfortable Skin: Right lower leg with anterior erythema and no associated tenderness.    Right leg cellulitis -Start cefadroxil 500 mg BID x7 days  Jacquelin Hawking, MD [facility] 12/04/2022, 4:37 PM

## 2022-12-04 NOTE — Consult Note (Signed)
Neurology Consultation    Reason for Consult: MG exacerbation  CC: Fever, body aches  HISTORY OF PRESENT ILLNESS   HPI   Anthony Carlson is a 62 y.o. male with a past medical history of chronic fatigue syndrome, gastric sleeve surgery, neuropathy, chronic low back pain/spinal stenosis here for evaluation of seronegative Myasthenia Gravis diagnosed by [facility] in 2016 by an outside neurology. He currently follows with Dr. Delbert Phenix with [facility] and was seen in February of 2024. He had an EMG and necrotizing myopathy work up done at this time as well. He was diagnosed with myasthenia gravis in 2016. He was then taking pyridostigmine and cellcept without much benefit. In 2023 he was admitted to [facility] for urosepsis and was thought to have an MG exacerbation. He received IVIG without improvement. He stopped taking CellCept in February but he is still taking Mestinon. He denies worsening shortness of breath at this time. He states that when he has exacerbations he typically notices it in his neck first. He is currently using a power wheel chair.  He reports Tmax 101.7 at home. Current Tmax for 24 hours 100.1.  At his last appointment it was suggested to him that he may not have MG and they did further testing including an EMG.   Ordered at last OP Neuro Appt  CK - 231 Necrotizing Myopathy Evaluation -Negative Anti-Nuclear Antibody (ANA) - Negative Extractable Nuclear Antigen Screen (ENA) - 0.10 Vitamin B12 Level -362 Vitamin B1, Whole Blood - 300 Copper, serum - 78 Paraneoplastic Antibody Panel - Negative Monoclonal Gammopathy Workup  Magnesium Level 2.0 Serum Free Light Chains 1.56/1.52  EMG  This is an abnormal and complicated study.  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  There is evidence of a chronic lumbosacral radiculopathy.  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    History is obtained  from:Patient, chart review     ROS: Pertinent items noted in HPI and remainder of comprehensive ROS otherwise negative.   PAST MEDICAL HISTORY    Past Medical History:  Past Medical History:  Diagnosis Date   Arthritis    Chronic fatigue    Depression    GERD (gastroesophageal reflux disease)    Gout    Hypertension    IBS (irritable bowel syndrome)    Memory changes    Mental disorder    Myasthenia gravis (HCC) 02/2015   OSA on CPAP     No family history on file. Family History  Problem Relation Age of Onset   Alcoholism Mother    Drug abuse Sister    Drug abuse Sister    Severe combined immunodeficiency Maternal Uncle    Suicidality Maternal Uncle    COPD Other    Diabetes Other    Cancer Other    Hypertension Other     Allergies:  Allergies  Allergen Reactions   Avelox [Moxifloxacin Hcl In Nacl] Anaphylaxis   Hydromorphone Other (See Comments)    Agitation    Pyridostigmine Diarrhea and Nausea Only    Social History:   reports that he quit smoking about 36 years ago. His smoking use included cigarettes. He has a 28.00 pack-year smoking history. He has never used smokeless tobacco. He reports that he does not currently use drugs. He reports that he does not drink alcohol.    Medications Medications Prior to Admission  Medication Sig Dispense Refill   acetaminophen (TYLENOL) 500 MG tablet Take 1,000 mg  by mouth 2 (two) times daily as needed for moderate pain or headache.      albuterol (VENTOLIN HFA) 108 (90 Base) MCG/ACT inhaler Inhale 2 puffs into the lungs every 6 (six) hours as needed for wheezing or shortness of breath. 8 g 6   amLODipine (NORVASC) 5 MG tablet Take 5 mg by mouth daily.     Armodafinil 250 MG tablet Take 250 mg by mouth daily.     atorvastatin (LIPITOR) 40 MG tablet Take 40 mg by mouth at bedtime.   2   Azelastine-Fluticasone 137-50 MCG/ACT SUSP Place 2 sprays into the nose 2 (two) times daily as needed (allergies).      Cholecalciferol (VITAMIN D3) 5000 units CAPS Take 5,000 Units by mouth daily.     diclofenac sodium (VOLTAREN) 1 % GEL Apply 1 application topically 4 (four) times daily as needed for pain.  3   doxazosin (CARDURA) 4 MG tablet Take 4 mg by mouth daily.     famotidine (PEPCID) 40 MG tablet Take 40 mg by mouth at bedtime.     Multiple Vitamin (MULTIVITAMIN WITH MINERALS) TABS tablet Take 1 tablet by mouth daily.     omeprazole (PRILOSEC) 40 MG capsule Take 40 mg by mouth 2 (two) times daily.     pregabalin (LYRICA) 200 MG capsule Take 200 mg by mouth 2 (two) times daily.     protein supplement shake (PREMIER PROTEIN) LIQD Take 325 mLs (11 oz total) by mouth 4 (four) times daily. 10 Can 0   sucralfate (CARAFATE) 1 g tablet Take 1 tablet by mouth at bedtime.     tadalafil (CIALIS) 20 MG tablet Take 20 mg by mouth daily as needed for erectile dysfunction.     tamsulosin (FLOMAX) 0.4 MG CAPS capsule Take 0.4 mg by mouth every evening.     testosterone cypionate (DEPOTESTOSTERONE CYPIONATE) 200 MG/ML injection Inject 200 mg into the muscle every 14 (fourteen) days.      ULORIC 40 MG tablet Take 40 mg by mouth daily.  2   hyoscyamine (LEVSIN SL) 0.125 MG SL tablet Place 0.125 mg under the tongue 3 (three) times daily as needed (bladder spasms and GI).  (Patient not taking: Reported on 12/04/2022)     valACYclovir (VALTREX) 1000 MG tablet Take 1 tablet (1,000 mg total) by mouth 3 (three) times daily. (Patient not taking: Reported on 12/04/2022) 21 tablet 0    EXAMINATION    Current vital signs:    12/04/2022    1:32 PM 12/04/2022    1:00 PM 12/04/2022   11:10 AM  Vitals with BMI  Systolic 109 120 161  Diastolic 62 62 68  Pulse 84 86 82    Examination:  GENERAL: Awake, alert in NAD HEENT: - Normocephalic and atraumatic, dry mm, no lymphadenopathy, no Thyromegally LUNGS - Regular, mild SOB at rest CV - S1S2 RRR, equal pulses bilaterally. ABDOMEN - Soft, nontender, nondistended with normoactive  BS Ext: warm, well perfused, intact peripheral pulses, right foot with 2+ edema Integumentary:  Skin intact on clothed exam, erythema noted on right shin/calf, warm to touch  NEURO:  Mental Status: AA&Ox3  Language: speech is clear.  Intact naming, repetition, fluency, and comprehension. Cranial Nerves:  II: PERRL. Visual fields full to confrontation.  III, IV, VI: EOM intact. Eyelids elevate symmetrically. Blinks to threat.  V: Sensation intact V1-3 symmetrically  VII: no facial asymmetry   VIII: hearing intact to voice IX, X: Palate elevates symmetrically. Phonation is normal.  WR:[id number] shrug  5/5 and symmetrical  XII: tongue is midline without fasciculations. Motor:  RUE:  grip   5/5    biceps  4/5    triceps  4/5      LUE: grip  5/5    biceps  4/5    triceps  4/5 RLE: thigh  4/5    plantar flexion   4-/5     dorsiflexion   5/5        LLE: thigh   4/5  plantar flexion    4/5     dorsiflexion    5/5  Neck Flexion 4-/5 with give way weakness Counts to 23 on a single breath  Sensation- Intact to light touch bilaterally Coordination: FTN intact bilaterally, no ataxia in BLE, no abnormal movements  Gait- deferred    LABS   I have reviewed labs in epic and the results pertinent to this consultation are:  No results found for: "LDLCALC" Lab Results  Component Value Date   ALT 43 12/04/2022   AST 29 12/04/2022   ALKPHOS 98 12/04/2022   BILITOT 0.8 12/04/2022   No results found for: "HGBA1C" Lab Results  Component Value Date   WBC 10.5 12/04/2022   HGB 12.2 (L) 12/04/2022   HCT 36.0 (L) 12/04/2022   MCV 82.2 12/04/2022   PLT 114 (L) 12/04/2022   No results found for: "VITAMINB12" No results found for: "FOLATE" Lab Results  Component Value Date   NA 141 12/04/2022   K 3.7 12/04/2022   CL 103 12/04/2022   CO2 24 12/04/2022     DIAGNOSTIC IMAGING/PROCEDURES   I have reviewed the images obtained:, as below    CXR- poor inspiration, no active disease  suspected  ASSESSMENT/PLAN    Assessment:  62 y.o. male with a pertinent medical history of chronic low back pain/spinal stenosis here for evaluation of seronegative Myasthenia Gravis diagnosed by [facility] in 2016 by an outside neurology. He currently follows with Dr. Delbert Phenix with [facility] and was seen in February of 2024.   Impression: MG Exacerbation after RSV vaccine  Right lower extremity cellulitis  Recommendations: - Medicine admission - Every 8 hours NIFs and VC - NPO until swallow eval by SLP. - Recommend elective intubation for respiratory compromise if VC falls below 15 to 20 mL/kg and or NIFs falls below -20cm/H2O. If poor effort and not sure, can always get ABG to assess for CO2 retention. Elective intubation for airway cmopromise if she has difficulty clearing her secretions. Oxygen saturation should not be used to make decision regarding intubation. - Medications that may worsen or trigger MG exacerbation: Class IA antiarrhythmics, magnesium, flouroquinolones, macrolides, aminoglycosides, penicillamine, curare, interferon alpha, botox, quinine. Use with caution: calcium channel blocker, beta blockers and statins.    -- Patient seen and examined by NP/APP with MD. MD to update note as needed.   Elmer Picker, DNP, FNP-BC [facility] Pager: [phone]  ATTENDING ATTESTATION:  H/o MG s/p RSV vaccine and fever.  MG stable. Concern for possible right leg cellulitis.    No need for neuro workup or treatment at this time.    Discussed with pt and his wife.   Dr. Viviann Spare evaluated pt independently, reviewed imaging, chart, labs. Discussed and formulated plan with the Resident/APP. Changes were made to the note where appropriate. Please see APP/resident note above for details.      Magaby Rumberger,MD   **This documentation was dictated using Dragon Medical Software and may contain inadvertent errors **

## 2022-12-04 NOTE — ED Notes (Signed)
ED TO INPATIENT HANDOFF REPORT  Name/Age/Gender Anthony Carlson 62 y.o. male  Code Status    Code Status Orders  (From admission, onward)           Start     Ordered   12/04/22 0805  Full code  Continuous       Question:  By:  Answer:  Consent: discussion documented in EHR   12/04/22 0804           Code Status History     Date Active Date Inactive Code Status Order ID Comments User Context   10/20/2017 1956 10/21/2017 2154 Full Code [id number]  Pearson Grippe, MD ED   10/14/2017 1535 10/15/2017 1342 Full Code [id number]  Glenna Fellows, MD Inpatient       Home/SNF/Other Home  Chief Complaint Myasthenia gravis (HCC) [G70.00]  Level of Care/Admitting Diagnosis ED Disposition     ED Disposition  Admit   Condition  --   Comment  Hospital Area: [facility] [[id number]]  Level of Care: Med-Surg [16]  May place patient in observation at [facility] or [facility] if equivalent level of care is available:: No  Covid Evaluation: Asymptomatic - no recent exposure (last 10 days) testing not required  Diagnosis: Myasthenia gravis [facility]) [[id number]]  Admitting Physician: Maryln Gottron [[id number]]  Attending Physician: Olexa.Dam, MIR Jaxson.Roy [[id number]]          Medical History Past Medical History:  Diagnosis Date   Arthritis    Chronic fatigue    Depression    GERD (gastroesophageal reflux disease)    Gout    Hypertension    IBS (irritable bowel syndrome)    Memory changes    Mental disorder    Myasthenia gravis (HCC) 02/2015   OSA on CPAP     Allergies Allergies  Allergen Reactions   Avelox [Moxifloxacin Hcl In Nacl] Anaphylaxis   Hydromorphone Other (See Comments)    Agitation    Pyridostigmine Diarrhea and Nausea Only    IV Location/Drains/Wounds Patient Lines/Drains/Airways Status     Active Line/Drains/Airways     Name Placement date Placement time Site Days   Peripheral IV 12/04/22 18 G 1" Left;Posterior Hand 12/04/22  --  Hand  less  than 1   Incision - 6 Ports Abdomen Right;Lateral Right;Medial Left;Umbilicus Mid;Upper Left Left;Lower 10/14/17  1216  -- 1877            Labs/Imaging Results for orders placed or performed during the hospital encounter of 12/04/22 (from the past 48 hour(s))  Urinalysis, w/ Reflex to Culture (Infection Suspected) -Urine, Clean Catch     Status: Abnormal   Collection Time: 12/04/22  4:36 AM  Result Value Ref Range   Specimen Source URINE, CLEAN CATCH    Color, Urine AMBER (A) YELLOW    Comment: BIOCHEMICALS MAY BE AFFECTED BY COLOR   APPearance CLEAR CLEAR   Specific Gravity, Urine 1.025 1.005 - 1.030   pH 5.0 5.0 - 8.0   Glucose, UA NEGATIVE NEGATIVE mg/dL   Hgb urine dipstick NEGATIVE NEGATIVE   Bilirubin Urine NEGATIVE NEGATIVE   Ketones, ur NEGATIVE NEGATIVE mg/dL   Protein, ur NEGATIVE NEGATIVE mg/dL   Nitrite NEGATIVE NEGATIVE   Leukocytes,Ua NEGATIVE NEGATIVE   RBC / HPF 0-5 0 - 5 RBC/hpf   WBC, UA 0-5 0 - 5 WBC/hpf    Comment:        Reflex urine culture not performed if WBC <=10, OR if Squamous epithelial cells >5. If  Squamous epithelial cells >5 suggest recollection.    Bacteria, UA NONE SEEN NONE SEEN   Squamous Epithelial / HPF 0-5 0 - 5 /HPF   Mucus PRESENT     Comment: Performed at [facility], [address] [place], [place] [address]  Resp panel by RT-PCR (RSV, Flu A&B, Covid) Anterior Nasal Swab     Status: None   Collection Time: 12/04/22  4:39 AM   Specimen: Anterior Nasal Swab  Result Value Ref Range   SARS Coronavirus 2 by RT PCR NEGATIVE NEGATIVE    Comment: (NOTE) SARS-CoV-2 target nucleic acids are NOT DETECTED.  The SARS-CoV-2 RNA is generally detectable in upper respiratory specimens during the acute phase of infection. The lowest concentration of SARS-CoV-2 viral copies this assay can detect is 138 copies/mL. A negative result does not preclude SARS-Cov-2 infection and should not be used as the sole basis for treatment  or other patient management decisions. A negative result may occur with  improper specimen collection/handling, submission of specimen other than nasopharyngeal swab, presence of viral mutation(s) within the areas targeted by this assay, and inadequate number of viral copies(<138 copies/mL). A negative result must be combined with clinical observations, patient history, and epidemiological information. The expected result is Negative.  Fact Sheet for Patients:  BloggerCourse.com  Fact Sheet for Healthcare Providers:  SeriousBroker.it  This test is no t yet approved or cleared by the [place] FDA and  has been authorized for detection and/or diagnosis of SARS-CoV-2 by FDA under an Emergency Use Authorization (EUA). This EUA will remain  in effect (meaning this test can be used) for the duration of the COVID-19 declaration under Section 564(b)(1) of the Act, 21 U.S.C.section 360bbb-3(b)(1), unless the authorization is terminated  or revoked sooner.       Influenza A by PCR NEGATIVE NEGATIVE   Influenza B by PCR NEGATIVE NEGATIVE    Comment: (NOTE) The Xpert Xpress SARS-CoV-2/FLU/RSV plus assay is intended as an aid in the diagnosis of influenza from Nasopharyngeal swab specimens and should not be used as a sole basis for treatment. Nasal washings and aspirates are unacceptable for Xpert Xpress SARS-CoV-2/FLU/RSV testing.  Fact Sheet for Patients: BloggerCourse.com  Fact Sheet for Healthcare Providers: SeriousBroker.it  This test is not yet approved or cleared by the [place] FDA and has been authorized for detection and/or diagnosis of SARS-CoV-2 by FDA under an Emergency Use Authorization (EUA). This EUA will remain in effect (meaning this test can be used) for the duration of the COVID-19 declaration under Section 564(b)(1) of the Act, 21 U.S.C. section  360bbb-3(b)(1), unless the authorization is terminated or revoked.     Resp Syncytial Virus by PCR NEGATIVE NEGATIVE    Comment: (NOTE) Fact Sheet for Patients: BloggerCourse.com  Fact Sheet for Healthcare Providers: SeriousBroker.it  This test is not yet approved or cleared by the [place] FDA and has been authorized for detection and/or diagnosis of SARS-CoV-2 by FDA under an Emergency Use Authorization (EUA). This EUA will remain in effect (meaning this test can be used) for the duration of the COVID-19 declaration under Section 564(b)(1) of the Act, 21 U.S.C. section 360bbb-3(b)(1), unless the authorization is terminated or revoked.  Performed at [facility], [address], [place], [place] [address]   Lactic acid, plasma     Status: None   Collection Time: 12/04/22  5:00 AM  Result Value Ref Range   Lactic Acid, Venous 1.3 0.5 - 1.9 mmol/L  Comment: Performed at [facility], [address] [place], [place] [address]  Comprehensive metabolic panel     Status: Abnormal   Collection Time: 12/04/22  5:00 AM  Result Value Ref Range   Sodium 138 135 - 145 mmol/L   Potassium 3.6 3.5 - 5.1 mmol/L   Chloride 105 98 - 111 mmol/L   CO2 24 22 - 32 mmol/L   Glucose, Bld 117 (H) 70 - 99 mg/dL    Comment: Glucose reference range applies only to samples taken after fasting for at least 8 hours.   BUN 19 8 - 23 mg/dL   Creatinine, Ser 6.21 0.61 - 1.24 mg/dL   Calcium 8.6 (L) 8.9 - 10.3 mg/dL   Total Protein 6.2 (L) 6.5 - 8.1 g/dL   Albumin 3.9 3.5 - 5.0 g/dL   AST 29 15 - 41 U/L   ALT 43 0 - 44 U/L   Alkaline Phosphatase 98 38 - 126 U/L   Total Bilirubin 0.8 0.3 - 1.2 mg/dL   GFR, Estimated >30 >86 mL/min    Comment: (NOTE) Calculated using the CKD-EPI Creatinine Equation (2021)    Anion gap 9 5 - 15    Comment: Performed at [facility], [address],  [place], [place] [address]  CBC with Differential     Status: Abnormal   Collection Time: 12/04/22  5:00 AM  Result Value Ref Range   WBC 10.5 4.0 - 10.5 K/uL   RBC 4.60 4.22 - 5.81 MIL/uL   Hemoglobin 12.1 (L) 13.0 - 17.0 g/dL   HCT 96.2 (L) 95.2 - 84.1 %   MCV 82.2 80.0 - 100.0 fL   MCH 26.3 26.0 - 34.0 pg   MCHC 32.0 30.0 - 36.0 g/dL   RDW 32.4 (H) 40.1 - 02.7 %   Platelets 114 (L) 150 - 400 K/uL   nRBC 0.0 0.0 - 0.2 %   Neutrophils Relative % 90 %   Neutro Abs 9.4 (H) 1.7 - 7.7 K/uL   Lymphocytes Relative 3 %   Lymphs Abs 0.3 (L) 0.7 - 4.0 K/uL   Monocytes Relative 7 %   Monocytes Absolute 0.8 0.1 - 1.0 K/uL   Eosinophils Relative 0 %   Eosinophils Absolute 0.0 0.0 - 0.5 K/uL   Basophils Relative 0 %   Basophils Absolute 0.0 0.0 - 0.1 K/uL   Immature Granulocytes 0 %   Abs Immature Granulocytes 0.03 0.00 - 0.07 K/uL    Comment: Performed at [facility], [address] [place], [place] [address]  Protime-INR     Status: None   Collection Time: 12/04/22  5:00 AM  Result Value Ref Range   Prothrombin Time 14.3 11.4 - 15.2 seconds   INR 1.1 0.8 - 1.2    Comment: (NOTE) INR goal varies based on device and disease states. Performed at [facility], [address] [place], [place] [address]   APTT     Status: None   Collection Time: 12/04/22  5:00 AM  Result Value Ref Range   aPTT 32 24 - 36 seconds    Comment: Performed at [facility], [address], [place], [place] [address]  I-stat chem 8, ED     Status: Abnormal   Collection Time: 12/04/22  5:27 AM  Result Value Ref Range   Sodium 141 135 - 145 mmol/L   Potassium 3.7 3.5 - 5.1 mmol/L   Chloride 103 98 - 111 mmol/L  BUN 18 8 - 23 mg/dL   Creatinine, Ser 4.09 0.61 - 1.24 mg/dL   Glucose, Bld 811 (H) 70 - 99 mg/dL    Comment: Glucose reference range applies only to samples taken after fasting for at least 8 hours.   Calcium, Ion 1.16 1.15 - 1.40 mmol/L   TCO2  28 22 - 32 mmol/L   Hemoglobin 12.2 (L) 13.0 - 17.0 g/dL   HCT 91.4 (L) 78.2 - 95.6 %  Lactic acid, plasma     Status: None   Collection Time: 12/04/22  6:35 AM  Result Value Ref Range   Lactic Acid, Venous 1.2 0.5 - 1.9 mmol/L    Comment: Performed at [facility], [address], [place], [place] [address]   DG Chest 2 View  Result Date: 12/04/2022 CLINICAL DATA:  Fever and body aches.  Hypoxia. EXAM: CHEST - 2 VIEW COMPARISON:  Earlier same day FINDINGS: The patient has not taken a deep inspiration. Heart size is normal. Mediastinal shadows are normal. Lungs appear clear. Posterior costophrenic angles are not included on the lateral view but no pleural effusion is visible. No abnormal bone finding. IMPRESSION: Poor inspiration. No active disease suspected. Electronically Signed   By: Paulina Fusi M.D.   On: 12/04/2022 07:58   DG Chest Port 1 View  Result Date: 12/04/2022 CLINICAL DATA:  62 year old male with history of fever and urinary tract infection. Possible sepsis. EXAM: PORTABLE CHEST 1 VIEW COMPARISON:  Chest x-ray 10/20/2017. FINDINGS: Lung volumes are very low. Poorly defined bibasilar opacities may reflect areas of atelectasis and/or consolidation. No definite pleural effusions. No pneumothorax. No evidence of pulmonary edema. Heart size appears borderline enlarged. The patient is rotated to the left on today's exam, resulting in distortion of the mediastinal contours and reduced diagnostic sensitivity and specificity for mediastinal pathology. IMPRESSION: 1. Suboptimal low volume chest x-ray demonstrating bibasilar opacities which may reflect areas of atelectasis and/or consolidation. Electronically Signed   By: Trudie Reed M.D.   On: 12/04/2022 06:16    Pending Labs Unresulted Labs (From admission, onward)     Start     Ordered   12/05/22 0500  Basic metabolic panel  Tomorrow morning,   R        12/04/22 0804   12/05/22 0500  CBC  Tomorrow morning,   R         12/04/22 0804   12/04/22 0804  HIV Antibody (routine testing w rflx)  (HIV Antibody (Routine testing w reflex) panel)  Once,   R        12/04/22 0804            Vitals/Pain Today's Vitals   12/04/22 0424 12/04/22 0426 12/04/22 0821  BP: (!) 141/76    Pulse: 95    Resp: 20    Temp: 100.1 F (37.8 C)  98.7 F (37.1 C)  TempSrc: Oral  Oral  SpO2: 94%    PainSc:  5      Isolation Precautions No active isolations  Medications Medications  pantoprazole (PROTONIX) EC tablet 40 mg (has no administration in time range)  amLODipine (NORVASC) tablet 5 mg (has no administration in time range)  enoxaparin (LOVENOX) injection 40 mg (has no administration in time range)  acetaminophen (TYLENOL) tablet 650 mg (has no administration in time range)    Or  acetaminophen (TYLENOL) suppository 650 mg (has no administration in time range)  traZODone (DESYREL) tablet 25 mg (has no administration in time range)  ondansetron (  ZOFRAN) tablet 4 mg (has no administration in time range)    Or  ondansetron (ZOFRAN) injection 4 mg (has no administration in time range)  albuterol (PROVENTIL) (2.5 MG/3ML) 0.083% nebulizer solution 2.5 mg (has no administration in time range)  sodium chloride 0.9 % bolus 1,000 mL (1,000 mLs Intravenous New Bag/Given 12/04/22 0503)    Mobility walks

## 2022-12-04 NOTE — ED Provider Notes (Signed)
[facility] AT [facility] Provider Note  CSN: [id number] Arrival date & time: 12/04/22 0411  Chief Complaint(s) Fever  HPI Anthony Carlson is a 62 y.o. male with a past medical history listed below including hypertension, chronic fatigue, myasthenia gravis who is WC dependent. He presents to the emergency department with generalized malaise, myalgias since yesterday who developed a fever with a temperature of 101.7 at home.  Patient reports that he got a RSV vaccine yesterday.  Endorses rhinorrhea.  Denied any coughing or congestion.  No chest pain or shortness of breath.  Reported some nausea without emesis.  Reports chronic diarrhea without any acute change or blood.  He has baseline abdominal pain related to his IBS that is unchanged.  No urinary symptoms.  No other physical complaints.  The history is provided by the patient.    Past Medical History Past Medical History:  Diagnosis Date   Arthritis    Chronic fatigue    Depression    GERD (gastroesophageal reflux disease)    Gout    Hypertension    IBS (irritable bowel syndrome)    Memory changes    Mental disorder    Myasthenia gravis (HCC) 02/2015   OSA on CPAP    Patient Active Problem List   Diagnosis Date Noted   Myasthenia gravis (HCC) 12/04/2022   Syncope 10/20/2017   ARF (acute renal failure) (HCC) 10/20/2017   BPH (benign prostatic hyperplasia) 10/20/2017   Hypertension 10/20/2017   Hyperlipidemia 10/20/2017   Morbid obesity (HCC) 10/14/2017   Ptosis of eyelid 02/24/2016   Spinal stenosis, lumbar region, with neurogenic claudication 02/24/2016   Home Medication(s) Prior to Admission medications   Medication Sig Start Date End Date Taking? Authorizing Provider  acetaminophen (TYLENOL) 500 MG tablet Take 1,000 mg by mouth 2 (two) times daily as needed for moderate pain or headache.     [provider]  albuterol (VENTOLIN HFA) 108 (90 Base) MCG/ACT inhaler Inhale 2 puffs  into the lungs every 6 (six) hours as needed for wheezing or shortness of breath. 02/28/21   Icard, Rachel Bo, DO  amLODipine (NORVASC) 5 MG tablet TAKE ONE TABLET BY MOUTH DAILY FOR BLOOD PRESSURE 05/25/22   [provider]  Armodafinil 250 MG tablet TAKE ONE TABLET BY MOUTH ONCE A DAY CHANGED FROM MODAFINIL 200 MG TWICE DAILY 02/20/21   [provider]  atorvastatin (LIPITOR) 40 MG tablet Take 40 mg by mouth at bedtime.  03/13/16   [provider]  Azelastine-Fluticasone 137-50 MCG/ACT SUSP Place 2 sprays into the nose 2 (two) times daily as needed (allergies).    [provider]  benzoyl peroxide 10 % gel Apply 1 application topically daily as needed (acne).    [provider]  Cholecalciferol (VITAMIN D3) 5000 units CAPS Take 1 capsule by mouth daily.    [provider]  clindamycin (CLEOCIN T) 1 % external solution Apply 1 application topically daily as needed (acne).  09/23/17   [provider]  diclofenac sodium (VOLTAREN) 1 % GEL Apply 1 application topically 4 (four) times daily as needed for pain. 09/24/17   [provider]  doxazosin (CARDURA) 2 MG tablet Take 2 mg by mouth daily.    [provider]  famotidine (PEPCID) 40 MG tablet Take by mouth. 02/15/21   [provider]  hyoscyamine (LEVSIN SL) 0.125 MG SL tablet Place 0.125 mg under the tongue 3 (three) times daily as needed (bladder spasms and GI).  [provider]  Multiple Vitamin (MULTIVITAMIN WITH MINERALS) TABS tablet Take 1 tablet by mouth daily.    [provider]  omeprazole (PRILOSEC) 40 MG capsule Take 40 mg by mouth 2 (two) times daily.    [provider]  pregabalin (LYRICA) 200 MG capsule TAKE ONE CAPSULE BY MOUTH TWICE A DAY FOR NEUROPATHIC PAIN, REPLACES GABAPENTIN - INCREASED FROM 100MG  TWICE A DAY 06/07/22   [provider]  protein supplement shake (PREMIER PROTEIN) LIQD Take 325 mLs (11 oz total) by  mouth 4 (four) times daily. 10/21/17   Marguerita Merles Latif, DO  sucralfate (CARAFATE) 1 g tablet Take 1 tablet by mouth at bedtime. 02/17/21   [provider]  tadalafil (CIALIS) 20 MG tablet Take 20 mg by mouth daily as needed for erectile dysfunction.    [provider]  tamsulosin (FLOMAX) 0.4 MG CAPS capsule Take 1 capsule by mouth every evening. 01/02/21   [provider]  testosterone cypionate (DEPOTESTOSTERONE CYPIONATE) 200 MG/ML injection Inject 200 mg into the muscle every 14 (fourteen) days.     [provider]  triamcinolone cream (KENALOG) 0.1 % APPLY SMALL AMOUNT TO AFFECTED AREA TWICE A DAY AS NEEDED 11/02/21   [provider]  ULORIC 40 MG tablet Take 40 mg by mouth daily. 08/08/17   [provider]  valACYclovir (VALTREX) 1000 MG tablet Take 1 tablet (1,000 mg total) by mouth 3 (three) times daily. 08/26/22   Eustace Moore, MD                                                                                                                                    Allergies Avelox [moxifloxacin hcl in nacl], Hydromorphone, and Pyridostigmine  Review of Systems Review of Systems As noted in HPI  Physical Exam Vital Signs  I have reviewed the triage vital signs BP (!) 141/76 (BP Location: Left Arm)   Pulse 95   Temp 100.1 F (37.8 C) (Oral)   Resp 20   SpO2 89% on RA  Physical Exam Vitals reviewed.  Constitutional:      General: He is not in acute distress.    Appearance: He is well-developed. He is obese. He is not diaphoretic.  HENT:     Head: Normocephalic and atraumatic.     Right Ear: Tympanic membrane normal.     Left Ear: Tympanic membrane normal.     Nose: Nose normal.     Mouth/Throat:     Mouth: No oral lesions.     Tongue: No lesions.     Pharynx: No posterior oropharyngeal erythema.     Tonsils: No tonsillar exudate.  Eyes:     General: No scleral icterus.       Right eye: No discharge.        Left eye:  No discharge.     Conjunctiva/sclera: Conjunctivae normal.     Pupils: Pupils are equal,  round, and reactive to light.  Cardiovascular:     Rate and Rhythm: Normal rate and regular rhythm.     Heart sounds: No murmur heard.    No friction rub. No gallop.  Pulmonary:     Effort: Pulmonary effort is normal. No respiratory distress.     Breath sounds: Normal breath sounds. No stridor. No rales.  Abdominal:     General: There is no distension.     Palpations: Abdomen is soft.     Tenderness: There is no abdominal tenderness.  Musculoskeletal:        General: No tenderness.     Cervical back: Normal range of motion and neck supple.  Skin:    General: Skin is warm and dry.     Findings: No erythema or rash.  Neurological:     Mental Status: He is alert and oriented to person, place, and time.     Comments: Fatigue with upward gaze <30sec     ED Results and Treatments Labs (all labs ordered are listed, but only abnormal results are displayed) Labs Reviewed  COMPREHENSIVE METABOLIC PANEL - Abnormal; Notable for the following components:      Result Value   Glucose, Bld 117 (*)    Calcium 8.6 (*)    Total Protein 6.2 (*)    All other components within normal limits  CBC WITH DIFFERENTIAL/PLATELET - Abnormal; Notable for the following components:   Hemoglobin 12.1 (*)    HCT 37.8 (*)    RDW 17.8 (*)    Platelets 114 (*)    Neutro Abs 9.4 (*)    Lymphs Abs 0.3 (*)    All other components within normal limits  URINALYSIS, W/ REFLEX TO CULTURE (INFECTION SUSPECTED) - Abnormal; Notable for the following components:   Color, Urine AMBER (*)    All other components within normal limits  I-STAT CHEM 8, ED - Abnormal; Notable for the following components:   Glucose, Bld 110 (*)    Hemoglobin 12.2 (*)    HCT 36.0 (*)    All other components within normal limits  RESP PANEL BY RT-PCR (RSV, FLU A&B, COVID)  [id number]  LACTIC ACID, PLASMA  LACTIC ACID, PLASMA  PROTIME-INR  APTT  HIV  ANTIBODY (ROUTINE TESTING W REFLEX)                                                                                                                         EKG  EKG Interpretation  Date/Time:  Tuesday December 04 2022 04:27:20 EDT Ventricular Rate:  96 PR Interval:  166 QRS Duration: 84 QT Interval:  335 QTC Calculation: 424 R Axis:   4 Text Interpretation: Sinus tachycardia Supraventricular bigeminy Low voltage, precordial leads Confirmed by Drema Pry [phone]) on 12/04/2022 5:44:29 AM       Radiology DG Chest 2 View  Result Date: 12/04/2022 CLINICAL DATA:  Fever and body aches.  Hypoxia. EXAM: CHEST - 2 VIEW COMPARISON:  Earlier same day FINDINGS:  The patient has not taken a deep inspiration. Heart size is normal. Mediastinal shadows are normal. Lungs appear clear. Posterior costophrenic angles are not included on the lateral view but no pleural effusion is visible. No abnormal bone finding. IMPRESSION: Poor inspiration. No active disease suspected. Electronically Signed   By: Paulina Fusi M.D.   On: 12/04/2022 07:58   DG Chest Port 1 View  Result Date: 12/04/2022 CLINICAL DATA:  62 year old male with history of fever and urinary tract infection. Possible sepsis. EXAM: PORTABLE CHEST 1 VIEW COMPARISON:  Chest x-ray 10/20/2017. FINDINGS: Lung volumes are very low. Poorly defined bibasilar opacities may reflect areas of atelectasis and/or consolidation. No definite pleural effusions. No pneumothorax. No evidence of pulmonary edema. Heart size appears borderline enlarged. The patient is rotated to the left on today's exam, resulting in distortion of the mediastinal contours and reduced diagnostic sensitivity and specificity for mediastinal pathology. IMPRESSION: 1. Suboptimal low volume chest x-ray demonstrating bibasilar opacities which may reflect areas of atelectasis and/or consolidation. Electronically Signed   By: Trudie Reed M.D.   On: 12/04/2022 06:16    Medications Ordered in  ED Medications  pantoprazole (PROTONIX) EC tablet 40 mg (has no administration in time range)  amLODipine (NORVASC) tablet 5 mg (has no administration in time range)  enoxaparin (LOVENOX) injection 40 mg (has no administration in time range)  acetaminophen (TYLENOL) tablet 650 mg (has no administration in time range)    Or  acetaminophen (TYLENOL) suppository 650 mg (has no administration in time range)  traZODone (DESYREL) tablet 25 mg (has no administration in time range)  ondansetron (ZOFRAN) tablet 4 mg (has no administration in time range)    Or  ondansetron (ZOFRAN) injection 4 mg (has no administration in time range)  albuterol (PROVENTIL) (2.5 MG/3ML) 0.083% nebulizer solution 2.5 mg (has no administration in time range)  sodium chloride 0.9 % bolus 1,000 mL (1,000 mLs Intravenous New Bag/Given 12/04/22 0503)   Procedures Procedures  (including critical care time) Medical Decision Making / ED Course   Medical Decision Making Amount and/or Complexity of Data Reviewed Labs: ordered. Decision-making details documented in ED Course. Radiology: ordered and independent interpretation performed. Decision-making details documented in ED Course. ECG/medicine tests: ordered and independent interpretation performed. Decision-making details documented in ED Course.  Risk Prescription drug management. Decision regarding hospitalization.    Patient presents for fever with generalized fatigue. Reported URI symptoms and recent vaccine.  Noted to be mildly hypoxic with EMS and upon arrival.  Placed on 2 L nasal cannula.  Sources of fever include viral URI, postvaccine, given the hypoxia will need to assess for pneumonia.  Hypoxia and generalized fatigue may also be myasthenia gravis exacerbation.  CBC without leukocytosis but white count is up from the patient's baseline.  No anemia. Metabolic panel without significant electrolyte derangements or renal insufficiency. Lactic acid  normal. Chest x-ray with poor aeration.  Subtle bibasilar opacities favoring atelectasis but will need to rule out pneumonia. NIF: -60  On reevaluation, patient and wife updated on findings.  Patient was weaned off of oxygen and monitored during my reassessment of approximately 10 minutes.  He remained with good sats on room air.  Discussed possibility of pneumonia versus atelectasis given the poor inspiration.  Will plan to repeat two-view x-ray.  If there is evidence of pneumonia, patient will be started on antibiotics.  If not we will hold.  Clinical Course as of 12/04/22 0819  Tue Dec 04, 2022  0730 I spoke with Dr. Viviann Spare  from neurology regarding his possible MG exacerbation. He agreed with admitting patient for evaluation. Requested [facility] admission. [PC]  0800 I consult the hospitalist service and spoke with Dr. Erenest Blank who agreed to admit patient for further workup and management. [PC]    Clinical Course User Index [PC] Jorgen Wolfinger, Amadeo Garnet, MD    Final Clinical Impression(s) / ED Diagnoses Final diagnoses:  Fever in adult  Myasthenia gravis [facility])    This chart was dictated using voice recognition software.  Despite best efforts to proofread,  errors can occur which can change the documentation meaning.    Nira Conn, MD 12/04/22 [phone]

## 2022-12-04 NOTE — ED Notes (Signed)
[facility] arrived for pt transport to [place] 

## 2022-12-05 DIAGNOSIS — I10 Essential (primary) hypertension: Secondary | ICD-10-CM | POA: Diagnosis present

## 2022-12-05 DIAGNOSIS — Z6841 Body Mass Index (BMI) 40.0 and over, adult: Secondary | ICD-10-CM | POA: Diagnosis not present

## 2022-12-05 DIAGNOSIS — T50B95A Adverse effect of other viral vaccines, initial encounter: Secondary | ICD-10-CM | POA: Diagnosis present

## 2022-12-05 DIAGNOSIS — R5083 Postvaccination fever: Secondary | ICD-10-CM | POA: Diagnosis present

## 2022-12-05 DIAGNOSIS — Z825 Family history of asthma and other chronic lower respiratory diseases: Secondary | ICD-10-CM | POA: Diagnosis not present

## 2022-12-05 DIAGNOSIS — L03115 Cellulitis of right lower limb: Secondary | ICD-10-CM | POA: Diagnosis present

## 2022-12-05 DIAGNOSIS — M79661 Pain in right lower leg: Secondary | ICD-10-CM | POA: Diagnosis not present

## 2022-12-05 DIAGNOSIS — D61818 Other pancytopenia: Secondary | ICD-10-CM | POA: Diagnosis present

## 2022-12-05 DIAGNOSIS — M5417 Radiculopathy, lumbosacral region: Secondary | ICD-10-CM | POA: Diagnosis present

## 2022-12-05 DIAGNOSIS — Z1152 Encounter for screening for COVID-19: Secondary | ICD-10-CM | POA: Diagnosis not present

## 2022-12-05 DIAGNOSIS — E785 Hyperlipidemia, unspecified: Secondary | ICD-10-CM | POA: Diagnosis present

## 2022-12-05 DIAGNOSIS — Z79899 Other long term (current) drug therapy: Secondary | ICD-10-CM | POA: Diagnosis not present

## 2022-12-05 DIAGNOSIS — G7000 Myasthenia gravis without (acute) exacerbation: Secondary | ICD-10-CM | POA: Diagnosis present

## 2022-12-05 DIAGNOSIS — Z79890 Hormone replacement therapy (postmenopausal): Secondary | ICD-10-CM | POA: Diagnosis not present

## 2022-12-05 DIAGNOSIS — K580 Irritable bowel syndrome with diarrhea: Secondary | ICD-10-CM | POA: Diagnosis present

## 2022-12-05 DIAGNOSIS — Z9079 Acquired absence of other genital organ(s): Secondary | ICD-10-CM | POA: Diagnosis not present

## 2022-12-05 DIAGNOSIS — Z87891 Personal history of nicotine dependence: Secondary | ICD-10-CM | POA: Diagnosis not present

## 2022-12-05 DIAGNOSIS — R5382 Chronic fatigue, unspecified: Secondary | ICD-10-CM | POA: Diagnosis present

## 2022-12-05 DIAGNOSIS — K219 Gastro-esophageal reflux disease without esophagitis: Secondary | ICD-10-CM | POA: Diagnosis present

## 2022-12-05 DIAGNOSIS — Z813 Family history of other psychoactive substance abuse and dependence: Secondary | ICD-10-CM | POA: Diagnosis not present

## 2022-12-05 DIAGNOSIS — N400 Enlarged prostate without lower urinary tract symptoms: Secondary | ICD-10-CM | POA: Diagnosis present

## 2022-12-05 DIAGNOSIS — Z833 Family history of diabetes mellitus: Secondary | ICD-10-CM | POA: Diagnosis not present

## 2022-12-05 DIAGNOSIS — Z6372 Alcoholism and drug addiction in family: Secondary | ICD-10-CM | POA: Diagnosis not present

## 2022-12-05 DIAGNOSIS — Z811 Family history of alcohol abuse and dependence: Secondary | ICD-10-CM | POA: Diagnosis not present

## 2022-12-05 LAB — BASIC METABOLIC PANEL
Anion gap: 8 (ref 5–15)
BUN: 14 mg/dL (ref 8–23)
CO2: 25 mmol/L (ref 22–32)
Calcium: 8.4 mg/dL — ABNORMAL LOW (ref 8.9–10.3)
Chloride: 107 mmol/L (ref 98–111)
Creatinine, Ser: 0.99 mg/dL (ref 0.61–1.24)
GFR, Estimated: >60 mL/min (ref >60)
Glucose, Bld: 110 mg/dL — ABNORMAL HIGH (ref 70–99)
Potassium: 3.6 mmol/L (ref 3.5–5.1)
Sodium: 140 mmol/L (ref 135–145)

## 2022-12-05 LAB — CBC
HCT: 35.3 % — ABNORMAL LOW (ref 39.0–52.0)
Hemoglobin: 11.3 g/dL — ABNORMAL LOW (ref 13.0–17.0)
MCH: 26.1 pg (ref 26.0–34.0)
MCHC: 32 g/dL (ref 30.0–36.0)
MCV: 81.5 fL (ref 80.0–100.0)
Platelets: 105 10*3/uL — ABNORMAL LOW (ref 150–400)
RBC: 4.33 MIL/uL (ref 4.22–5.81)
RDW: 17.8 % — ABNORMAL HIGH (ref 11.5–15.5)
WBC: 4.4 10*3/uL (ref 4.0–10.5)
nRBC: 0 % (ref 0.0–0.2)

## 2022-12-05 LAB — MRSA NEXT GEN BY PCR, NASAL: MRSA by PCR Next Gen: NOT DETECTED

## 2022-12-05 MED ORDER — ATORVASTATIN CALCIUM 40 MG PO TABS
40.0000 mg | ORAL_TABLET | Freq: Every day | ORAL | Status: DC
  Administered 2022-12-05: 40 mg via ORAL
  Filled 2022-12-05: qty 1

## 2022-12-05 MED ORDER — PYRIDOSTIGMINE BROMIDE ER 180 MG PO TBCR
180.0000 mg | EXTENDED_RELEASE_TABLET | Freq: Every day | ORAL | Status: DC
  Administered 2022-12-05 – 2022-12-06 (×2): 180 mg via ORAL
  Filled 2022-12-05 (×3): qty 1

## 2022-12-05 MED ORDER — FLUTICASONE PROPIONATE 50 MCG/ACT NA SUSP
1.0000 | Freq: Two times a day (BID) | NASAL | Status: DC
  Filled 2022-12-05: qty 16

## 2022-12-05 MED ORDER — DOXAZOSIN MESYLATE 4 MG PO TABS
4.0000 mg | ORAL_TABLET | Freq: Every evening | ORAL | Status: DC
  Administered 2022-12-05: 4 mg via ORAL
  Filled 2022-12-05 (×2): qty 1

## 2022-12-05 MED ORDER — ADULT MULTIVITAMIN W/MINERALS CH
1.0000 | ORAL_TABLET | Freq: Every day | ORAL | Status: DC
  Administered 2022-12-05 – 2022-12-06 (×2): 1 via ORAL
  Filled 2022-12-05 (×2): qty 1

## 2022-12-05 MED ORDER — ALBUTEROL SULFATE (2.5 MG/3ML) 0.083% IN NEBU: 3.0000 mL | INHALATION_SOLUTION | Freq: Four times a day (QID) | RESPIRATORY_TRACT | Status: DC | PRN

## 2022-12-05 MED ORDER — ENSURE MAX PROTEIN PO LIQD
11.0000 [oz_av] | Freq: Four times a day (QID) | ORAL | Status: DC
  Administered 2022-12-05 – 2022-12-06 (×4): 11 [oz_av] via ORAL
  Filled 2022-12-05 (×7): qty 330

## 2022-12-05 MED ORDER — HYOSCYAMINE SULFATE 0.125 MG SL SUBL: 0.1250 mg | SUBLINGUAL_TABLET | Freq: Three times a day (TID) | SUBLINGUAL | Status: DC | PRN

## 2022-12-05 MED ORDER — AZELASTINE HCL 0.1 % NA SOLN
1.0000 | Freq: Two times a day (BID) | NASAL | Status: DC
  Administered 2022-12-05 – 2022-12-06 (×3): 1 via NASAL
  Filled 2022-12-05: qty 30

## 2022-12-05 MED ORDER — PREGABALIN 100 MG PO CAPS
200.0000 mg | ORAL_CAPSULE | Freq: Two times a day (BID) | ORAL | Status: DC
  Administered 2022-12-05 – 2022-12-06 (×3): 200 mg via ORAL
  Filled 2022-12-05 (×3): qty 2

## 2022-12-05 MED ORDER — FEBUXOSTAT 40 MG PO TABS
40.0000 mg | ORAL_TABLET | Freq: Every day | ORAL | Status: DC
  Administered 2022-12-05 – 2022-12-06 (×2): 40 mg via ORAL
  Filled 2022-12-05 (×2): qty 1

## 2022-12-05 MED ORDER — AZELASTINE-FLUTICASONE 137-50 MCG/ACT NA SUSP: 2.0000 | Freq: Two times a day (BID) | NASAL | Status: DC | PRN

## 2022-12-05 MED ORDER — CEFAZOLIN SODIUM-DEXTROSE 2-4 GM/100ML-% IV SOLN
2.0000 g | Freq: Three times a day (TID) | INTRAVENOUS | Status: DC
  Administered 2022-12-05 – 2022-12-06 (×3): 2 g via INTRAVENOUS
  Filled 2022-12-05 (×3): qty 100

## 2022-12-05 MED ORDER — FAMOTIDINE 20 MG PO TABS
40.0000 mg | ORAL_TABLET | Freq: Every day | ORAL | Status: DC
  Administered 2022-12-05: 40 mg via ORAL
  Filled 2022-12-05: qty 2

## 2022-12-05 MED ORDER — ARMODAFINIL 250 MG PO TABS
250.0000 mg | ORAL_TABLET | Freq: Every day | ORAL | Status: DC
  Administered 2022-12-05 – 2022-12-06 (×2): 250 mg via ORAL
  Filled 2022-12-05 (×2): qty 1

## 2022-12-05 MED ORDER — VITAMIN D 25 MCG (1000 UNIT) PO TABS
5000.0000 [IU] | ORAL_TABLET | Freq: Every day | ORAL | Status: DC
  Administered 2022-12-05 – 2022-12-06 (×2): 5000 [IU] via ORAL
  Filled 2022-12-05 (×2): qty 5

## 2022-12-05 MED ORDER — TAMSULOSIN HCL 0.4 MG PO CAPS
0.4000 mg | ORAL_CAPSULE | Freq: Every morning | ORAL | Status: DC
  Administered 2022-12-05 – 2022-12-06 (×2): 0.4 mg via ORAL
  Filled 2022-12-05 (×2): qty 1

## 2022-12-05 MED ORDER — SUCRALFATE 1 G PO TABS: 1.0000 g | ORAL_TABLET | Freq: Two times a day (BID) | ORAL | Status: DC | PRN

## 2022-12-05 NOTE — Progress Notes (Signed)
Patient is currently sleeping, resting comfortably and isn't in any distress. NIF / VC held at this time per order stating to do while awake.

## 2022-12-05 NOTE — Progress Notes (Signed)
PROGRESS NOTE    Anthony Carlson  ONG:[id number] DOB: Nov 12, 1960 DOA: 12/04/2022 PCP: Erskine Emery, NP   Brief Narrative:  The patient is a 62 year old morbidly obese Caucasian male with a past medical history significant for manometry chronic fatigue, hypertension, hyperlipidemia, GERD, myasthenia gravis as well as other comorbidities who presented to hospital with fever.  There is concern that he may have had a myasthenia gravis flare and patient was having some upper respiratory symptoms for the last 24 hours prior to coming in with congestion and runny nose.  He states that this happens with his medications usually.  He recently went and got an RSV vaccine yesterday and then subsequently later developed a fever 101 that evening.  Continue to have weakness for the last 24 hours and typically has myasthenia gravis usually is worse in his bilateral shoulders but is right greater than left with weakness as well as bilateral hip flexor weakness.  Most the time he gets around in his power wheelchair and he has some diplopia at baseline but for the last 24 hours he had weakness that was worse than normal.  Also developed some right lower extremity erythema and warmth on the day of admission.  Chest x-ray done on admission and was negative for any acute findings.  Neurology was consulted and they do not believe the patient has a myasthenia flare and they did not recommend initiating any therapeutics at this time.  He is being treated for his cellulitis and has not been changed to IV cefazolin and will continue treatment and rule out DVT given his right leg extremity swelling given his lack of mobility.  If DVT is negative likely can be transitioned to oral antibiotics in the next 24 hours and discharged home given that he is stable from a myasthenia gravis standpoint.  Assessment and Plan:  ? Myastenia gravis flare ruled out -Patient is comfortable on room air, awake and alert, eating and protecting his  airway -Observation admission but changed to Inpatient given Cellulitis -Neurology consultation appreciated and they feel that his energy is stable and they feel that his symptoms are related to his right leg cellulitis and feel that there is no further need for neurowork-up or treatment at this time -Patient was getting nips MVC every 8 hours -U/A Negative, RSV, Influzenza A/B and SARS Co-V 2 negative -Continue with home pyridostigmine -CXR done and showed "The patient has not taken a deep inspiration. Heart size is normal. Mediastinal shadows are normal. Lungs appear clear. Posterior costophrenic angles are not included on the lateral view but no pleural effusion is visible. No abnormal bone finding." -MRSA Negative  -Will need outpatient further workup and evaluation by his neurologist   Right Leg Cellulitis present on admission -Right leg is warm and erythematous and swollen compared to left -Will rule out DVT just in case and obtain a vascular ultrasound -Initiated on Antibiotics and will start IV Cefazolin -WBC Trend as below -Unfortunately no Blood Cx obtained on Admission -Continue IV Abx and change to po at D/C   Fever -Reports a fever of 101 after receiving RSV vaccine on 6/10; here he is afebrile, with no leukocytosis -Also found to have a Right Leg Erythema and Warm with concern for Cellulitis -WBC and LA Trend: Recent Labs  Lab 12/04/22 0500 12/04/22 0635 12/05/22 0322  WBC 10.5  --  4.4  LATICACIDVEN 1.3 1.2  --   -Initiated on po Cefadroxil but will change to IV Cefazolin; No need for Vanc  as MRSA PCR negative   Hypertension -Continue home Amlodipine 5 mg po Daily -Continue to Monitor BP per Protocol -Last BP reading was 148/84   GERD/GI Prophylaxis -Continue oral PPI with Pantoprazole 40 mg po Daily and Famotidine 40 mg qHS and Sucralfate 1 gram po BIDprn  BPH -C/w Doxazosin 4 mg po qHS and Tamsulosin 0.4 mg po qMorning   Normocytic Anemia -Hgb/Hct  Trend: Recent Labs  Lab 12/04/22 0500 12/04/22 0527 12/05/22 0322  HGB 12.1* 12.2* 11.3*  HCT 37.8* 36.0* 35.3*  MCV 82.2  --  81.5  -Check Anemia Panel in the AM -Continue to Monitor for S/Sx of Bleeding; No overt bleeding noted -Repeat CBC in the AM  Thrombocytopenia -Plt Count Trend: Recent Labs  Lab 12/04/22 0500 12/05/22 0322  PLT 114* 105*  -Continue to Monitor for S/Sx of Bleeding; No overt bleeding noted -Repeat CBC in the AM   Morbid Obesity -Complicates overall prognosis and care -Estimated body mass index is 41.5 kg/m as calculated from the following:   Height as of 08/26/22: 5\' 7"  (1.702 m).   Weight as of 08/26/22: 120.2 kg.  -Weight Loss and Dietary Counseling given  DVT prophylaxis: enoxaparin (LOVENOX) injection 40 mg Start: 12/04/22 1800 SCDs Start: 12/04/22 0804    Code Status: Full Code Family Communication: No family currently at bedside  Disposition Plan:  Level of care: Med-Surg Status is: Observation The patient will require care spanning > 2 midnights and should be moved to inpatient because: Getting treated for right leg cellulitis and will need to ensure that he does not have a DVT   Consultants:  Neurology  Procedures:  As delineated as above  Antimicrobials:  Anti-infectives (From admission, onward)    Start     Dose/Rate Route Frequency Ordered Stop   12/05/22 1200  ceFAZolin (ANCEF) IVPB 2g/100 mL premix        2 g 200 mL/hr over 30 Minutes Intravenous Every 8 hours 12/05/22 1106 12/12/22 1159   12/04/22 2200  cefadroxil (DURICEF) capsule 500 mg  Status:  Discontinued        500 mg Oral 2 times daily 12/04/22 1634 12/05/22 1106       Subjective: Seen and examined at bedside and his right leg was warm and erythematous and painful to palpate.  States his breathing has no issues and his runny nose is improved.  No nausea or vomiting.  Feels weak but states that he is feeling okay.  No other concerns or complaints this time and has  not had any more fevers.  Thinks the redness may be slowly improving  Objective: Vitals:   12/05/22 0705 12/05/22 1057 12/05/22 1523 12/05/22 1612  BP: (!) 150/88 (!) 151/93  (!) 148/84  Pulse: 63 67  70  Resp: 18     Temp: 98.1 F (36.7 C) 98.3 F (36.8 C)    TempSrc: Oral Oral    SpO2: 95% 97%  97%  Weight:   122 kg    No intake or output data in the 24 hours ending 12/05/22 1925  Filed Weights   12/05/22 1523  Weight: 122 kg   Examination: Physical Exam:  Constitutional: WN/WD morbidly obese Caucasian male in no acute distress Respiratory: Diminished to auscultation bilaterally, no wheezing, rales, rhonchi or crackles. Normal respiratory effort and patient is not tachypenic. No accessory muscle use.  Unlabored breathing Cardiovascular: RRR, no murmurs / rubs / gallops. S1 and S2 auscultated.  Has 1+ lower extremity edema on the right compared  to left Abdomen: Soft, non-tender, distended secondary to body habitus. Bowel sounds positive.  GU: Deferred. Musculoskeletal: No clubbing / cyanosis of digits/nails. No joint deformity upper and lower extremities. Skin: Right leg is warm and erythematous on the lower extremity and painful to palpate Neurologic: CN 2-12 grossly intact with no focal deficits. Romberg sign and cerebellar reflexes not assessed.  Psychiatric: Normal judgment and insight. Alert and oriented x 3. Normal mood and appropriate affect.   Data Reviewed: I have personally reviewed following labs and imaging studies  CBC: Recent Labs  Lab 12/04/22 0500 12/04/22 0527 12/05/22 0322  WBC 10.5  --  4.4  NEUTROABS 9.4*  --   --   HGB 12.1* 12.2* 11.3*  HCT 37.8* 36.0* 35.3*  MCV 82.2  --  81.5  PLT 114*  --  105*   Basic Metabolic Panel: Recent Labs  Lab 12/04/22 0500 12/04/22 0527 12/05/22 0322  NA 138 141 140  K 3.6 3.7 3.6  CL 105 103 107  CO2 24  --  25  GLUCOSE 117* 110* 110*  BUN 19 18 14   CREATININE 0.87 0.90 0.99  CALCIUM 8.6*  --  8.4*    GFR: Estimated Creatinine Clearance: 96.8 mL/min (by C-G formula based on SCr of 0.99 mg/dL). Liver Function Tests: Recent Labs  Lab 12/04/22 0500  AST 29  ALT 43  ALKPHOS 98  BILITOT 0.8  PROT 6.2*  ALBUMIN 3.9   No results for input(s): "LIPASE", "AMYLASE" in the last 168 hours. No results for input(s): "AMMONIA" in the last 168 hours. Coagulation Profile: Recent Labs  Lab 12/04/22 0500  INR 1.1   Cardiac Enzymes: No results for input(s): "CKTOTAL", "CKMB", "CKMBINDEX", "TROPONINI" in the last 168 hours. BNP (last 3 results) No results for input(s): "PROBNP" in the last 8760 hours. HbA1C: No results for input(s): "HGBA1C" in the last 72 hours. CBG: No results for input(s): "GLUCAP" in the last 168 hours. Lipid Profile: No results for input(s): "CHOL", "HDL", "LDLCALC", "TRIG", "CHOLHDL", "LDLDIRECT" in the last 72 hours. Thyroid Function Tests: No results for input(s): "TSH", "T4TOTAL", "FREET4", "T3FREE", "THYROIDAB" in the last 72 hours. Anemia Panel: No results for input(s): "VITAMINB12", "FOLATE", "FERRITIN", "TIBC", "IRON", "RETICCTPCT" in the last 72 hours. Sepsis Labs: Recent Labs  Lab 12/04/22 0500 12/04/22 9604  LATICACIDVEN 1.3 1.2    Recent Results (from the past 240 hour(s))  Resp panel by RT-PCR (RSV, Flu A&B, Covid) Anterior Nasal Swab     Status: None   Collection Time: 12/04/22  4:39 AM   Specimen: Anterior Nasal Swab  Result Value Ref Range Status   SARS Coronavirus 2 by RT PCR NEGATIVE NEGATIVE Final    Comment: (NOTE) SARS-CoV-2 target nucleic acids are NOT DETECTED.  The SARS-CoV-2 RNA is generally detectable in upper respiratory specimens during the acute phase of infection. The lowest concentration of SARS-CoV-2 viral copies this assay can detect is 138 copies/mL. A negative result does not preclude SARS-Cov-2 infection and should not be used as the sole basis for treatment or other patient management decisions. A negative result  may occur with  improper specimen collection/handling, submission of specimen other than nasopharyngeal swab, presence of viral mutation(s) within the areas targeted by this assay, and inadequate number of viral copies(<138 copies/mL). A negative result must be combined with clinical observations, patient history, and epidemiological information. The expected result is Negative.  Fact Sheet for Patients:  BloggerCourse.com  Fact Sheet for Healthcare Providers:  SeriousBroker.it  This test is no t  yet approved or cleared by the [place] and  has been authorized for detection and/or diagnosis of SARS-CoV-2 by FDA under an Emergency Use Authorization (EUA). This EUA will remain  in effect (meaning this test can be used) for the duration of the COVID-19 declaration under Section 564(b)(1) of the Act, 21 U.S.C.section 360bbb-3(b)(1), unless the authorization is terminated  or revoked sooner.       Influenza A by PCR NEGATIVE NEGATIVE Final   Influenza B by PCR NEGATIVE NEGATIVE Final    Comment: (NOTE) The Xpert Xpress SARS-CoV-2/FLU/RSV plus assay is intended as an aid in the diagnosis of influenza from Nasopharyngeal swab specimens and should not be used as a sole basis for treatment. Nasal washings and aspirates are unacceptable for Xpert Xpress SARS-CoV-2/FLU/RSV testing.  Fact Sheet for Patients: BloggerCourse.com  Fact Sheet for Healthcare Providers: SeriousBroker.it  This test is not yet approved or cleared by the [place] FDA and has been authorized for detection and/or diagnosis of SARS-CoV-2 by FDA under an Emergency Use Authorization (EUA). This EUA will remain in effect (meaning this test can be used) for the duration of the COVID-19 declaration under Section 564(b)(1) of the Act, 21 U.S.C. section 360bbb-3(b)(1), unless the authorization is terminated  or revoked.     Resp Syncytial Virus by PCR NEGATIVE NEGATIVE Final    Comment: (NOTE) Fact Sheet for Patients: BloggerCourse.com  Fact Sheet for Healthcare Providers: SeriousBroker.it  This test is not yet approved or cleared by the [place] FDA and has been authorized for detection and/or diagnosis of SARS-CoV-2 by FDA under an Emergency Use Authorization (EUA). This EUA will remain in effect (meaning this test can be used) for the duration of the COVID-19 declaration under Section 564(b)(1) of the Act, 21 U.S.C. section 360bbb-3(b)(1), unless the authorization is terminated or revoked.  Performed at [facility], [address], [place], [place] [address]   MRSA Next Gen by PCR, Nasal     Status: None   Collection Time: 12/05/22 11:07 AM   Specimen: Nasal Mucosa; Nasal Swab  Result Value Ref Range Status   MRSA by PCR Next Gen NOT DETECTED NOT DETECTED Final    Comment: (NOTE) The GeneXpert MRSA Assay (FDA approved for NASAL specimens only), is one component of a comprehensive MRSA colonization surveillance program. It is not intended to diagnose MRSA infection nor to guide or monitor treatment for MRSA infections. Test performance is not FDA approved in patients less than 22 years old. Performed at [facility], [address] [place], [place] [address]     Radiology Studies: DG Chest 2 View  Result Date: 12/04/2022 CLINICAL DATA:  Fever and body aches.  Hypoxia. EXAM: CHEST - 2 VIEW COMPARISON:  Earlier same day FINDINGS: The patient has not taken a deep inspiration. Heart size is normal. Mediastinal shadows are normal. Lungs appear clear. Posterior costophrenic angles are not included on the lateral view but no pleural effusion is visible. No abnormal bone finding. IMPRESSION: Poor inspiration. No active disease suspected. Electronically Signed   By: Paulina Fusi M.D.   On: 12/04/2022 07:58    DG Chest Port 1 View  Result Date: 12/04/2022 CLINICAL DATA:  62 year old male with history of fever and urinary tract infection. Possible sepsis. EXAM: PORTABLE CHEST 1 VIEW COMPARISON:  Chest x-ray 10/20/2017. FINDINGS: Lung volumes are very low. Poorly defined bibasilar opacities may reflect areas of atelectasis and/or consolidation. No definite pleural effusions. No pneumothorax. No evidence of pulmonary  edema. Heart size appears borderline enlarged. The patient is rotated to the left on today's exam, resulting in distortion of the mediastinal contours and reduced diagnostic sensitivity and specificity for mediastinal pathology. IMPRESSION: 1. Suboptimal low volume chest x-ray demonstrating bibasilar opacities which may reflect areas of atelectasis and/or consolidation. Electronically Signed   By: Trudie Reed M.D.   On: 12/04/2022 06:16    Scheduled Meds:  amLODipine  5 mg Oral Daily   Armodafinil  250 mg Oral Daily   atorvastatin  40 mg Oral QHS   azelastine  1 spray Each Nare BID   And   fluticasone  1 spray Each Nare BID   cholecalciferol  5,000 Units Oral Daily   diclofenac  75 mg Oral BID   doxazosin  4 mg Oral QPM   enoxaparin (LOVENOX) injection  40 mg Subcutaneous Q24H   famotidine  40 mg Oral QHS   febuxostat  40 mg Oral Daily   multivitamin with minerals  1 tablet Oral Daily   pantoprazole  40 mg Oral Daily   pregabalin  200 mg Oral BID   Ensure Max Protein  11 oz Oral QID   pyridostigmine  180 mg Oral Daily   tamsulosin  0.4 mg Oral q morning   Continuous Infusions:   ceFAZolin (ANCEF) IV 2 g (12/05/22 1225)    LOS: 0 days   Marguerita Merles, DO [facility] Available via Epic secure chat 7am-7pm After these hours, please refer to coverage provider listed on [email or web address] 12/05/2022, 7:25 PM

## 2022-12-05 NOTE — Hospital Course (Addendum)
The patient is a 62 year old morbidly obese Caucasian male with a past medical history significant for manometry chronic fatigue, hypertension, hyperlipidemia, GERD, myasthenia gravis as well as other comorbidities who presented to hospital with fever.  There is concern that he may have had a myasthenia gravis flare and patient was having some upper respiratory symptoms for the last 24 hours prior to coming in with congestion and runny nose.  He states that this happens with his medications usually.  He recently went and got an RSV vaccine yesterday and then subsequently later developed a fever 101 that evening.  Continue to have weakness for the last 24 hours and typically has myasthenia gravis usually is worse in his bilateral shoulders but is right greater than left with weakness as well as bilateral hip flexor weakness.  Most the time he gets around in his power wheelchair and he has some diplopia at baseline but for the last 24 hours he had weakness that was worse than normal.  Also developed some right lower extremity erythema and warmth on the day of admission.  Chest x-ray done on admission and was negative for any acute findings.  Neurology was consulted and they do not believe the patient has a myasthenia flare and they did not recommend initiating any therapeutics at this time.    He is being treated for his cellulitis and has not been changed to IV cefazolin and will continue treatment and rule out DVT given his right leg extremity swelling given his lack of mobility.  Since DVT evaluation is negative he can be transitioned to oral antibiotics and discharged home given that he is stable from a myasthenia gravis standpoint.  Need to follow-up with PCP and Neurology in outpatient setting.  Assessment and Plan:  ? Myastenia gravis flare ruled out -Patient is comfortable on room air, awake and alert, eating and protecting his airway -Observation admission but changed to Inpatient given  Cellulitis -Neurology consultation appreciated and they feel that his energy is stable and they feel that his symptoms are related to his right leg cellulitis and feel that there is no further need for neurowork-up or treatment at this time -Patient was getting nips MVC every 8 hours -U/A Negative, RSV, Influzenza A/B and SARS Co-V 2 negative -Continue with home pyridostigmine -CXR done and showed "The patient has not taken a deep inspiration. Heart size is normal. Mediastinal shadows are normal. Lungs appear clear. Posterior costophrenic angles are not included on the lateral view but no pleural effusion is visible. No abnormal bone finding." -MRSA Negative  -Will need outpatient further workup and evaluation by his neurologist   Right Leg Cellulitis present on admission, slowly improving -Right leg is warm and erythematous and swollen compared to left -Will rule out DVT just in case and obtain a vascular ultrasound this was negative for DVT -Initiated on Antibiotics and will start IV Cefazolin -WBC Trend as below -Unfortunately no Blood Cx obtained on Admission -Continue IV Abx and changed to po at D/C for 8 more days to complete 10 days total  Fever, improved -Reports a fever of 101 after receiving RSV vaccine on 6/10; here he is afebrile, with no leukocytosis -Also found to have a Right Leg Erythema and Warm with concern for Cellulitis -WBC and LA Trend: Recent Labs  Lab 12/04/22 0500 12/04/22 0635 12/05/22 0322 12/06/22 0424  WBC 10.5  --  4.4 3.7*  LATICACIDVEN 1.3 1.2  --   --   -Initiated on po Cefadroxil but will change  to IV Cefazolin; No need for Vancomycin as MRSA PCR negative   Hypertension -Continue home Amlodipine 5 mg po Daily -Continue to Monitor BP per Protocol -Last BP reading was 132/84   GERD/GI Prophylaxis -Continue oral PPI with Pantoprazole 40 mg po Daily and Famotidine 40 mg qHS and Sucralfate 1 gram po BIDprn  BPH -C/w Doxazosin 4 mg po qHS and  Tamsulosin 0.4 mg po qMorning   Normocytic Anemia -Hgb/Hct Trend: Recent Labs  Lab 12/04/22 0500 12/04/22 0527 12/05/22 0322 12/06/22 0424  HGB 12.1* 12.2* 11.3* 11.1*  HCT 37.8* 36.0* 35.3* 34.8*  MCV 82.2  --  81.5 83.9  -Check Anemia Panel in the AM -Continue to Monitor for S/Sx of Bleeding; No overt bleeding noted -Repeat CBC in the AM  Thrombocytopenia -Plt Count Trend: Recent Labs  Lab 12/04/22 0500 12/05/22 0322 12/06/22 0424  PLT 114* 105* 104*  -Continue to Monitor for S/Sx of Bleeding; No overt bleeding noted -Repeat CBC within 1 week  Pancytopenia, mild -CBC Trend: Recent Labs  Lab 12/04/22 0500 12/04/22 0527 12/05/22 0322 12/06/22 0424  WBC 10.5  --  4.4 3.7*  HGB 12.1* 12.2* 11.3* 11.1*  HCT 37.8* 36.0* 35.3* 34.8*  MCV 82.2  --  81.5 83.9  PLT 114*  --  105* 104*  -Continue to Monitor and Trend and repeat CBC within 1 week  Morbid Obesity -Complicates overall prognosis and care -Estimated body mass index is 42.13 kg/m as calculated from the following:   Height as of 08/26/22: 5\' 7"  (1.702 m).   Weight as of this encounter: 122 kg.  -Weight Loss and Dietary Counseling given

## 2022-12-05 NOTE — Progress Notes (Signed)
VC: 3.1L  NIF: Greater than -40  With excellent patient effort.

## 2022-12-05 NOTE — Progress Notes (Signed)
Patient performed NIF and VC with good effort.  NIF- 45  VC 3.4 L

## 2022-12-06 ENCOUNTER — Inpatient Hospital Stay (HOSPITAL_COMMUNITY): Payer: No Typology Code available for payment source

## 2022-12-06 DIAGNOSIS — M79661 Pain in right lower leg: Secondary | ICD-10-CM | POA: Diagnosis not present

## 2022-12-06 DIAGNOSIS — G7000 Myasthenia gravis without (acute) exacerbation: Secondary | ICD-10-CM | POA: Diagnosis not present

## 2022-12-06 DIAGNOSIS — L03115 Cellulitis of right lower limb: Secondary | ICD-10-CM | POA: Diagnosis not present

## 2022-12-06 LAB — CBC WITH DIFFERENTIAL/PLATELET
Abs Immature Granulocytes: 0.01 10*3/uL (ref 0.00–0.07)
Basophils Absolute: 0 10*3/uL (ref 0.0–0.1)
Basophils Relative: 1 %
Eosinophils Absolute: 0.1 10*3/uL (ref 0.0–0.5)
Eosinophils Relative: 3 %
HCT: 34.8 % — ABNORMAL LOW (ref 39.0–52.0)
Hemoglobin: 11.1 g/dL — ABNORMAL LOW (ref 13.0–17.0)
Immature Granulocytes: 0 %
Lymphocytes Relative: 30 %
Lymphs Abs: 1.1 10*3/uL (ref 0.7–4.0)
MCH: 26.7 pg (ref 26.0–34.0)
MCHC: 31.9 g/dL (ref 30.0–36.0)
MCV: 83.9 fL (ref 80.0–100.0)
Monocytes Absolute: 0.5 10*3/uL (ref 0.1–1.0)
Monocytes Relative: 14 %
Neutro Abs: 1.9 10*3/uL (ref 1.7–7.7)
Neutrophils Relative %: 52 %
Platelets: 104 10*3/uL — ABNORMAL LOW (ref 150–400)
RBC: 4.15 MIL/uL — ABNORMAL LOW (ref 4.22–5.81)
RDW: 17.6 % — ABNORMAL HIGH (ref 11.5–15.5)
WBC: 3.7 10*3/uL — ABNORMAL LOW (ref 4.0–10.5)
nRBC: 0 % (ref 0.0–0.2)

## 2022-12-06 LAB — COMPREHENSIVE METABOLIC PANEL
ALT: 36 U/L (ref 0–44)
AST: 31 U/L (ref 15–41)
Albumin: 3 g/dL — ABNORMAL LOW (ref 3.5–5.0)
Alkaline Phosphatase: 75 U/L (ref 38–126)
Anion gap: 14 (ref 5–15)
BUN: 18 mg/dL (ref 8–23)
CO2: 20 mmol/L — ABNORMAL LOW (ref 22–32)
Calcium: 8.2 mg/dL — ABNORMAL LOW (ref 8.9–10.3)
Chloride: 106 mmol/L (ref 98–111)
Creatinine, Ser: 0.91 mg/dL (ref 0.61–1.24)
GFR, Estimated: >60 mL/min (ref >60)
Glucose, Bld: 96 mg/dL (ref 70–99)
Potassium: 3.7 mmol/L (ref 3.5–5.1)
Sodium: 140 mmol/L (ref 135–145)
Total Bilirubin: 0.2 mg/dL — ABNORMAL LOW (ref 0.3–1.2)
Total Protein: 5.6 g/dL — ABNORMAL LOW (ref 6.5–8.1)

## 2022-12-06 LAB — MAGNESIUM: Magnesium: 1.9 mg/dL (ref 1.7–2.4)

## 2022-12-06 LAB — PHOSPHORUS: Phosphorus: 4 mg/dL (ref 2.5–4.6)

## 2022-12-06 MED ORDER — ALBUTEROL SULFATE HFA 108 (90 BASE) MCG/ACT IN AERS: 2.0000 | INHALATION_SPRAY | Freq: Four times a day (QID) | RESPIRATORY_TRACT | 6 refills | Status: AC | PRN

## 2022-12-06 MED ORDER — CEFADROXIL 500 MG PO CAPS: 500.0000 mg | ORAL_CAPSULE | Freq: Two times a day (BID) | ORAL | 0 refills | Status: AC

## 2022-12-06 NOTE — Progress Notes (Signed)
VASCULAR LAB    Right lower extremity venous duplex has been performed.  See CV proc for preliminary results.   Halei Hanover, RVT 12/06/2022, 9:35 AM

## 2022-12-06 NOTE — Progress Notes (Signed)
Patient performed NIF and VC with great effort  NIF -35  VC 2.9 L

## 2022-12-06 NOTE — Discharge Summary (Signed)
Physician Discharge Summary   Patient: Anthony Carlson MRN: [id number] DOB: [birth year]  Admit date:     12/04/2022  Discharge date: 12/06/2022  Discharge Physician: Marguerita Merles, DO   PCP: Drucilla Chalet, MD   Recommendations at discharge:   Follow-up with PCP within 1 to 2 weeks and repeat CBC, CMP, mag, Phos within 1 week Follow-up with neurology in outpatient setting within 1 to 2 weeks  Discharge Diagnoses: Principal Problem:   Myasthenia gravis (HCC)  Resolved Problems:   * No resolved hospital problems. [facility] Course: The patient is a 61 year old morbidly obese Caucasian male with a past medical history significant for manometry chronic fatigue, hypertension, hyperlipidemia, GERD, myasthenia gravis as well as other comorbidities who presented to hospital with fever.  There is concern that he may have had a myasthenia gravis flare and patient was having some upper respiratory symptoms for the last 24 hours prior to coming in with congestion and runny nose.  He states that this happens with his medications usually.  He recently went and got an RSV vaccine yesterday and then subsequently later developed a fever 101 that evening.  Continue to have weakness for the last 24 hours and typically has myasthenia gravis usually is worse in his bilateral shoulders but is right greater than left with weakness as well as bilateral hip flexor weakness.  Most the time he gets around in his power wheelchair and he has some diplopia at baseline but for the last 24 hours he had weakness that was worse than normal.  Also developed some right lower extremity erythema and warmth on the day of admission.  Chest x-ray done on admission and was negative for any acute findings.  Neurology was consulted and they do not believe the patient has a myasthenia flare and they did not recommend initiating any therapeutics at this time.    He is being treated for his cellulitis and has not been changed to IV  cefazolin and will continue treatment and rule out DVT given his right leg extremity swelling given his lack of mobility.  Since DVT evaluation is negative he can be transitioned to oral antibiotics and discharged home given that he is stable from a myasthenia gravis standpoint.  Need to follow-up with PCP and Neurology in outpatient setting.  Assessment and Plan:  ? Myastenia gravis flare ruled out -Patient is comfortable on room air, awake and alert, eating and protecting his airway -Observation admission but changed to Inpatient given Cellulitis -Neurology consultation appreciated and they feel that his energy is stable and they feel that his symptoms are related to his right leg cellulitis and feel that there is no further need for neurowork-up or treatment at this time -Patient was getting nips MVC every 8 hours -U/A Negative, RSV, Influzenza A/B and SARS Co-V 2 negative -Continue with home pyridostigmine -CXR done and showed "The patient has not taken a deep inspiration. Heart size is normal. Mediastinal shadows are normal. Lungs appear clear. Posterior costophrenic angles are not included on the lateral view but no pleural effusion is visible. No abnormal bone finding." -MRSA Negative  -Will need outpatient further workup and evaluation by his neurologist   Right Leg Cellulitis present on admission, slowly improving -Right leg is warm and erythematous and swollen compared to left -Will rule out DVT just in case and obtain a vascular ultrasound this was negative for DVT -Initiated on Antibiotics and will start IV Cefazolin -WBC Trend as below -Unfortunately no Blood Cx obtained on  Admission -Continue IV Abx and changed to po at D/C for 8 more days to complete 10 days total  Fever, improved -Reports a fever of 101 after receiving RSV vaccine on 6/10; here he is afebrile, with no leukocytosis -Also found to have a Right Leg Erythema and Warm with concern for Cellulitis -WBC and LA  Trend: Recent Labs  Lab 12/04/22 0500 12/04/22 0635 12/05/22 0322 12/06/22 0424  WBC 10.5  --  4.4 3.7*  LATICACIDVEN 1.3 1.2  --   --   -Initiated on po Cefadroxil but will change to IV Cefazolin; No need for Vancomycin as MRSA PCR negative   Hypertension -Continue home Amlodipine 5 mg po Daily -Continue to Monitor BP per Protocol -Last BP reading was 132/84   GERD/GI Prophylaxis -Continue oral PPI with Pantoprazole 40 mg po Daily and Famotidine 40 mg qHS and Sucralfate 1 gram po BIDprn  BPH -C/w Doxazosin 4 mg po qHS and Tamsulosin 0.4 mg po qMorning   Normocytic Anemia -Hgb/Hct Trend: Recent Labs  Lab 12/04/22 0500 12/04/22 0527 12/05/22 0322 12/06/22 0424  HGB 12.1* 12.2* 11.3* 11.1*  HCT 37.8* 36.0* 35.3* 34.8*  MCV 82.2  --  81.5 83.9  -Check Anemia Panel in the AM -Continue to Monitor for S/Sx of Bleeding; No overt bleeding noted -Repeat CBC in the AM  Thrombocytopenia -Plt Count Trend: Recent Labs  Lab 12/04/22 0500 12/05/22 0322 12/06/22 0424  PLT 114* 105* 104*  -Continue to Monitor for S/Sx of Bleeding; No overt bleeding noted -Repeat CBC within 1 week  Pancytopenia, mild -CBC Trend: Recent Labs  Lab 12/04/22 0500 12/04/22 0527 12/05/22 0322 12/06/22 0424  WBC 10.5  --  4.4 3.7*  HGB 12.1* 12.2* 11.3* 11.1*  HCT 37.8* 36.0* 35.3* 34.8*  MCV 82.2  --  81.5 83.9  PLT 114*  --  105* 104*  -Continue to Monitor and Trend and repeat CBC within 1 week  Morbid Obesity -Complicates overall prognosis and care -Estimated body mass index is 42.13 kg/m as calculated from the following:   Height as of 08/26/22: 5\' 7"  (1.702 m).   Weight as of this encounter: 122 kg.  -Weight Loss and Dietary Counseling given  Consultants: Neurology  Procedures performed: As delineated as above  Disposition: Home Diet recommendation:  Cardiac diet DISCHARGE MEDICATION: Allergies as of 12/06/2022       Reactions   Avelox [moxifloxacin Hcl In Nacl] Anaphylaxis    Hydromorphone Other (See Comments)   Agitation    Pyridostigmine Diarrhea, Nausea Only        Medication List     TAKE these medications    acetaminophen 500 MG tablet Commonly known as: TYLENOL Take 1,000 mg by mouth 2 (two) times daily as needed for moderate pain or headache.   albuterol 108 (90 Base) MCG/ACT inhaler Commonly known as: VENTOLIN HFA Inhale 2 puffs into the lungs every 6 (six) hours as needed for wheezing or shortness of breath.   amLODipine 5 MG tablet Commonly known as: NORVASC Take 5 mg by mouth daily.   Armodafinil 250 MG tablet Take 250 mg by mouth daily.   atorvastatin 40 MG tablet Commonly known as: LIPITOR Take 40 mg by mouth at bedtime.   Azelastine-Fluticasone 137-50 MCG/ACT Susp Place 2 sprays into the nose 2 (two) times daily as needed (allergies).   cefadroxil 500 MG capsule Commonly known as: DURICEF Take 1 capsule (500 mg total) by mouth 2 (two) times daily for 8 days.   diclofenac sodium 1 %  Gel Commonly known as: VOLTAREN Apply 1 application topically 4 (four) times daily as needed for pain.   doxazosin 4 MG tablet Commonly known as: CARDURA Take 4 mg by mouth every evening.   famotidine 40 MG tablet Commonly known as: PEPCID Take 40 mg by mouth at bedtime.   hyoscyamine 0.125 MG SL tablet Commonly known as: LEVSIN SL Place 0.125 mg under the tongue 3 (three) times daily as needed (bladder spasms and GI).   multivitamin with minerals Tabs tablet Take 1 tablet by mouth daily.   omeprazole 40 MG capsule Commonly known as: PRILOSEC Take 40 mg by mouth 2 (two) times daily.   pregabalin 200 MG capsule Commonly known as: LYRICA Take 200 mg by mouth 2 (two) times daily.   protein supplement shake Liqd Commonly known as: PREMIER PROTEIN Take 325 mLs (11 oz total) by mouth 4 (four) times daily.   pyridostigmine 180 MG CR tablet Commonly known as: MESTINON Take 180 mg by mouth daily.   sucralfate 1 g tablet Commonly  known as: CARAFATE Take 1 tablet by mouth at bedtime.   tadalafil 20 MG tablet Commonly known as: CIALIS Take 20 mg by mouth daily as needed for erectile dysfunction.   tamsulosin 0.4 MG Caps capsule Commonly known as: FLOMAX Take 0.4 mg by mouth in the morning.   testosterone cypionate 200 MG/ML injection Commonly known as: DEPOTESTOSTERONE CYPIONATE Inject 200 mg into the muscle every 14 (fourteen) days.   Uloric 40 MG tablet Generic drug: febuxostat Take 40 mg by mouth daily.   valACYclovir 1000 MG tablet Commonly known as: Valtrex Take 1 tablet (1,000 mg total) by mouth 3 (three) times daily.   Vitamin D3 125 MCG (5000 UT) Caps Take 5,000 Units by mouth daily.        Follow-up Information     Erskine Emery, NP Follow up in 2 week(s).   Why: Hospital follow up Contact information: [address] [place] [address] [phone]                Discharge Exam: Filed Weights   12/05/22 1523  Weight: 122 kg   Vitals:   12/06/22 0421 12/06/22 0741  BP: 126/78 132/84  Pulse: 65 63  Resp: 18   Temp: 98.2 F (36.8 C) 98.3 F (36.8 C)  SpO2: 96% 98%   Examination: Physical Exam:  Constitutional: WN/WD morbidly obese Caucasian male in no acute distress Respiratory: Diminished to auscultation bilaterally, no wheezing, rales, rhonchi or crackles. Normal respiratory effort and patient is not tachypenic. No accessory muscle use.  Unlabored breathing Cardiovascular: RRR, no murmurs / rubs / gallops. S1 and S2 auscultated.  Has some lower extremity edema worse on the right compared to left Abdomen: Soft, non-tender, distended secondary to body habitus. Bowel sounds positive.  GU: Deferred. Musculoskeletal: No clubbing / cyanosis of digits/nails. No joint deformity upper and lower extremities.  Skin: Has some leg erythema and warmth which is tender to palpation Neurologic: CN 2-12 grossly intact with no focal deficits.  Romberg sign and cerebellar reflexes  not assessed.  Psychiatric: Normal judgment and insight. Alert and oriented x 3. Normal mood and appropriate affect.   Condition at discharge: stable  The results of significant diagnostics from this hospitalization (including imaging, microbiology, ancillary and laboratory) are listed below for reference.   Imaging Studies: VAS [place] LOWER EXTREMITY VENOUS (DVT)  Result Date: 12/06/2022  Lower Venous DVT Study Patient Name:  Anthony Carlson  Date of Exam:  12/06/2022 Medical Rec #: [id number]       Accession #:    [id number] Date of Birth: [birth year]      Patient Gender: M Patient Age:   13 years Exam Location:  [facility] Procedure:      VAS [place] LOWER EXTREMITY VENOUS (DVT) Referring Phys: Marguerita Merles --------------------------------------------------------------------------------  Indications: Erythema, Swelling, and Pain.  Comparison Study: No prior study on file Performing Technologist: Sherren Kerns RVS  Examination Guidelines: A complete evaluation includes B-mode imaging, spectral Doppler, color Doppler, and power Doppler as needed of all accessible portions of each vessel. Bilateral testing is considered an integral part of a complete examination. Limited examinations for reoccurring indications may be performed as noted. The reflux portion of the exam is performed with the patient in reverse Trendelenburg.  +---------+---------------+---------+-----------+----------+--------------+ RIGHT    CompressibilityPhasicitySpontaneityPropertiesThrombus Aging +---------+---------------+---------+-----------+----------+--------------+ CFV      Full           Yes      Yes                                 +---------+---------------+---------+-----------+----------+--------------+ SFJ      Full                                                        +---------+---------------+---------+-----------+----------+--------------+ FV Prox  Full                                                         +---------+---------------+---------+-----------+----------+--------------+ FV Mid   Full           Yes      Yes                                 +---------+---------------+---------+-----------+----------+--------------+ FV DistalFull                                                        +---------+---------------+---------+-----------+----------+--------------+ PFV      Full                                                        +---------+---------------+---------+-----------+----------+--------------+ POP      Full           Yes      Yes                                 +---------+---------------+---------+-----------+----------+--------------+ PTV      Full                                                        +---------+---------------+---------+-----------+----------+--------------+  PERO     Full                                                        +---------+---------------+---------+-----------+----------+--------------+   +----+---------------+---------+-----------+----------+--------------+ LEFTCompressibilityPhasicitySpontaneityPropertiesThrombus Aging +----+---------------+---------+-----------+----------+--------------+ CFV Full           Yes      Yes                                 +----+---------------+---------+-----------+----------+--------------+     Summary: RIGHT: - No evidence of deep vein thrombosis in the lower extremity. No indirect evidence of obstruction proximal to the inguinal ligament. - No cystic structure found in the popliteal fossa.  LEFT: - No evidence of common femoral vein obstruction.  *See table(s) above for measurements and observations. Electronically signed by Sherald Hess MD on 12/06/2022 at 10:39:29 AM.    Final    DG Chest 2 View  Result Date: 12/04/2022 CLINICAL DATA:  Fever and body aches.  Hypoxia. EXAM: CHEST - 2 VIEW COMPARISON:  Earlier same day FINDINGS: The patient has not taken a deep  inspiration. Heart size is normal. Mediastinal shadows are normal. Lungs appear clear. Posterior costophrenic angles are not included on the lateral view but no pleural effusion is visible. No abnormal bone finding. IMPRESSION: Poor inspiration. No active disease suspected. Electronically Signed   By: Paulina Fusi M.D.   On: 12/04/2022 07:58   DG Chest Port 1 View  Result Date: 12/04/2022 CLINICAL DATA:  62 year old male with history of fever and urinary tract infection. Possible sepsis. EXAM: PORTABLE CHEST 1 VIEW COMPARISON:  Chest x-ray 10/20/2017. FINDINGS: Lung volumes are very low. Poorly defined bibasilar opacities may reflect areas of atelectasis and/or consolidation. No definite pleural effusions. No pneumothorax. No evidence of pulmonary edema. Heart size appears borderline enlarged. The patient is rotated to the left on today's exam, resulting in distortion of the mediastinal contours and reduced diagnostic sensitivity and specificity for mediastinal pathology. IMPRESSION: 1. Suboptimal low volume chest x-ray demonstrating bibasilar opacities which may reflect areas of atelectasis and/or consolidation. Electronically Signed   By: Trudie Reed M.D.   On: 12/04/2022 06:16    Microbiology: Results for orders placed or performed during the hospital encounter of 12/04/22  Resp panel by RT-PCR (RSV, Flu A&B, Covid) Anterior Nasal Swab     Status: None   Collection Time: 12/04/22  4:39 AM   Specimen: Anterior Nasal Swab  Result Value Ref Range Status   SARS Coronavirus 2 by RT PCR NEGATIVE NEGATIVE Final    Comment: (NOTE) SARS-CoV-2 target nucleic acids are NOT DETECTED.  The SARS-CoV-2 RNA is generally detectable in upper respiratory specimens during the acute phase of infection. The lowest concentration of SARS-CoV-2 viral copies this assay can detect is 138 copies/mL. A negative result does not preclude SARS-Cov-2 infection and should not be used as the sole basis for treatment  or other patient management decisions. A negative result may occur with  improper specimen collection/handling, submission of specimen other than nasopharyngeal swab, presence of viral mutation(s) within the areas targeted by this assay, and inadequate number of viral copies(<138 copies/mL). A negative result must be combined with clinical observations, patient history, and epidemiological information. The expected result is Negative.  Fact Sheet for Patients:  BloggerCourse.com  Fact Sheet for Healthcare Providers:  SeriousBroker.it  This test is no t yet approved or cleared by the [place] FDA and  has been authorized for detection and/or diagnosis of SARS-CoV-2 by FDA under an Emergency Use Authorization (EUA). This EUA will remain  in effect (meaning this test can be used) for the duration of the COVID-19 declaration under Section 564(b)(1) of the Act, 21 U.S.C.section 360bbb-3(b)(1), unless the authorization is terminated  or revoked sooner.       Influenza A by PCR NEGATIVE NEGATIVE Final   Influenza B by PCR NEGATIVE NEGATIVE Final    Comment: (NOTE) The Xpert Xpress SARS-CoV-2/FLU/RSV plus assay is intended as an aid in the diagnosis of influenza from Nasopharyngeal swab specimens and should not be used as a sole basis for treatment. Nasal washings and aspirates are unacceptable for Xpert Xpress SARS-CoV-2/FLU/RSV testing.  Fact Sheet for Patients: BloggerCourse.com  Fact Sheet for Healthcare Providers: SeriousBroker.it  This test is not yet approved or cleared by the [place] FDA and has been authorized for detection and/or diagnosis of SARS-CoV-2 by FDA under an Emergency Use Authorization (EUA). This EUA will remain in effect (meaning this test can be used) for the duration of the COVID-19 declaration under Section 564(b)(1) of the Act, 21 U.S.C. section  360bbb-3(b)(1), unless the authorization is terminated or revoked.     Resp Syncytial Virus by PCR NEGATIVE NEGATIVE Final    Comment: (NOTE) Fact Sheet for Patients: BloggerCourse.com  Fact Sheet for Healthcare Providers: SeriousBroker.it  This test is not yet approved or cleared by the [place] FDA and has been authorized for detection and/or diagnosis of SARS-CoV-2 by FDA under an Emergency Use Authorization (EUA). This EUA will remain in effect (meaning this test can be used) for the duration of the COVID-19 declaration under Section 564(b)(1) of the Act, 21 U.S.C. section 360bbb-3(b)(1), unless the authorization is terminated or revoked.  Performed at [facility], [address] [place], [place] [address]   MRSA Next Gen by PCR, Nasal     Status: None   Collection Time: 12/05/22 11:07 AM   Specimen: Nasal Mucosa; Nasal Swab  Result Value Ref Range Status   MRSA by PCR Next Gen NOT DETECTED NOT DETECTED Final    Comment: (NOTE) The GeneXpert MRSA Assay (FDA approved for NASAL specimens only), is one component of a comprehensive MRSA colonization surveillance program. It is not intended to diagnose MRSA infection nor to guide or monitor treatment for MRSA infections. Test performance is not FDA approved in patients less than 77 years old. Performed at [facility], [address], [place], [place] [address]    Labs: CBC: Recent Labs  Lab 12/04/22 0500 12/04/22 0527 12/05/22 0322 12/06/22 0424  WBC 10.5  --  4.4 3.7*  NEUTROABS 9.4*  --   --  1.9  HGB 12.1* 12.2* 11.3* 11.1*  HCT 37.8* 36.0* 35.3* 34.8*  MCV 82.2  --  81.5 83.9  PLT 114*  --  105* 104*   Basic Metabolic Panel: Recent Labs  Lab 12/04/22 0500 12/04/22 0527 12/05/22 0322 12/06/22 0424  NA 138 141 140 140  K 3.6 3.7 3.6 3.7  CL 105 103 107 106  CO2 24  --  25 20*  GLUCOSE 117* 110* 110* 96  BUN 19 18 14 18    CREATININE 0.87 0.90 0.99 0.91  CALCIUM 8.6*  --  8.4* 8.2*  MG  --   --   --  1.9  PHOS  --   --   --  4.0   Liver Function Tests: Recent Labs  Lab 12/04/22 0500 12/06/22 0424  AST 29 31  ALT 43 36  ALKPHOS 98 75  BILITOT 0.8 0.2*  PROT 6.2* 5.6*  ALBUMIN 3.9 3.0*   CBG: No results for input(s): "GLUCAP" in the last 168 hours.  Discharge time spent: greater than 30 minutes.  Signed: Marguerita Merles, DO [facility] 12/06/2022

## 2022-12-06 NOTE — TOC Transition Note (Signed)
Transition of Care [facility]) - CM/SW Discharge Note   Patient Details  Name: Anthony Carlson MRN: [id number] Date of Birth: [birth year]  [facility] CM/SW Contact:  Harriet Masson, RN Phone Number: 12/06/2022, 1:01 PM   Clinical Narrative:     Patient stable for discharge.  Follows up at [facility], Dr. Reita May. Has all needed DME needs, wheelchair bound.  Son will transport home in [place].  No other TOC needs.  Final next level of care: Home/Self Care Barriers to Discharge: Barriers Resolved   Patient Goals and CMS Choice    Return home  Discharge Placement    home                     Discharge Plan and Services Additional resources added to the After Visit Summary for                                       Social Determinants of Health (SDOH) Interventions SDOH Screenings   Tobacco Use: Medium Risk (12/04/2022)     Readmission Risk Interventions    12/06/2022    1:01 PM  Readmission Risk Prevention Plan  Post Dischage Appt Complete  Medication Screening Complete  Transportation Screening Complete

## 2023-12-26 ENCOUNTER — Encounter: Payer: Self-pay | Admitting: Neurosurgery

## 2023-12-30 ENCOUNTER — Other Ambulatory Visit: Payer: Self-pay | Admitting: Neurosurgery

## 2023-12-30 DIAGNOSIS — M4722 Other spondylosis with radiculopathy, cervical region: Secondary | ICD-10-CM

## 2024-01-06 ENCOUNTER — Ambulatory Visit
Admission: RE | Admit: 2024-01-06 | Discharge: 2024-01-06 | Disposition: A | Discharge status: Home / Self Care | Source: Ambulatory Visit | Attending: Neurosurgery | Admitting: Neurosurgery

## 2024-01-06 DIAGNOSIS — M4722 Other spondylosis with radiculopathy, cervical region: Secondary | ICD-10-CM

## 2024-04-08 ENCOUNTER — Inpatient Hospital Stay
Admission: RE | Admit: 2024-04-08 | Discharge: 2024-04-08 | Disposition: A | Discharge status: Home / Self Care | Payer: Self-pay | Source: Ambulatory Visit | Attending: Neurosurgery | Admitting: Neurosurgery

## 2024-04-08 ENCOUNTER — Other Ambulatory Visit: Payer: Self-pay | Admitting: Family Medicine

## 2024-04-08 DIAGNOSIS — Z049 Encounter for examination and observation for unspecified reason: Secondary | ICD-10-CM

## 2024-04-09 NOTE — Progress Notes (Unsigned)
 Referring Physician:  Volanda Saupe, MD [address] [place],  [place] [address]  Primary Physician:  Volanda Saupe, MD  History of Present Illness: 04/21/2024 [name] is here today with both neck and back pain and profound progressive weakness and balance issues over the past 10 years.  He has had an extensive workup at the [facility] by neurology including a muscle biopsy and an IVIG  infusion without improvement or answers.  Questionable myasthenia gravis diagnosis.  As stated, patient is primarily in a wheelchair at this point  Lumbar spinal canal stenosis  Bilateral leg and arms weakness and numbness. Pain in lower back and neck.   Neck> back   Neck, shooting down right >left. Pinky and ring finger. Many years have had numbness and tingling, previous CTS  Feels like hands are not as weak as shoulders SA for one year, no incontinence   Profound weakness in shoulders and hips.  [facility]- [facility] negative Had a big dose of IVIG and did nothing   Bad balance issues- sqays for 10 eyars  Wheelchair since 2016   Hips and shoulder weakness.   Many years.   Duration: *** Location: *** Quality: *** Severity: ***  Precipitating: aggravated by *** Modifying factors: made better by *** Weakness: none Timing: *** Bowel/Bladder Dysfunction: none  Conservative measures:  Physical therapy: Has not participated in recently for his  back. Multimodal medical therapy including regular antiinflammatories: prednisone, pregabalin , diclofenac , acetaminophen   Injections:  Has had epidural steroid injections in lumber 5 years ago.  Past Surgery: ***  CALIEB LICHTMAN has ***no symptoms of cervical myelopathy.  The symptoms are causing a significant impact on the patient's life.   Review of Systems:  A 10 point review of systems is negative, except for the pertinent positives and negatives detailed in the HPI.  Past Medical History: Past  Medical History:  Diagnosis Date   Arthritis    Chronic fatigue    Depression    GERD (gastroesophageal reflux disease)    Gout    Hypertension    IBS (irritable bowel syndrome)    Memory changes    Mental disorder    Myasthenia gravis (HCC) 02/2015   [facility] on CPAP     Past Surgical History: Past Surgical History:  Procedure Laterality Date   ankle fusions     x 2    CARPAL TUNNEL RELEASE Left    2018   CYSTOSCOPY WITH HOLMIUM LASER LITHOTRIPSY  04/2022   LAPAROSCOPIC GASTRIC SLEEVE RESECTION N/A 10/14/2017   Procedure: LAPAROSCOPIC GASTRIC SLEEVE RESECTION, UPPER ENDOSCOPY;  Surgeon: Mikell Katz, MD;  Location: [facility];  Service: General;  Laterality: N/A;   THULIUM LASER TURP (TRANSURETHRAL RESECTION OF PROSTATE)  11/02/2016   vasectomy and reversal      Allergies: Allergies as of 04/21/2024 - Review Complete 04/21/2024  Allergen Reaction Noted   Avelox [moxifloxacin hcl in nacl] Anaphylaxis 02/23/2016   Hydromorphone Other (See Comments) 10/25/2016    Medications: Outpatient Encounter Medications as of 04/21/2024  Medication Sig   acetaminophen  (TYLENOL ) 500 MG tablet Take 1,000 mg by mouth 2 (two) times daily as needed for moderate pain or headache.    albuterol  (VENTOLIN  HFA) 108 (90 Base) MCG/ACT inhaler Inhale 2 puffs into the lungs every 6 (six) hours as needed for wheezing or shortness of breath.   amLODipine  (NORVASC ) 5 MG tablet Take 5 mg by mouth daily.   Armodafinil  250 MG tablet Take 250 mg by mouth daily.  atorvastatin  (LIPITOR) 40 MG tablet Take 40 mg by mouth at bedtime.    Azelastine -Fluticasone  137-50 MCG/ACT SUSP Place 2 sprays into the nose 2 (two) times daily as needed (allergies).   Cholecalciferol  (VITAMIN D3) 5000 units CAPS Take 5,000 Units by mouth daily.   diclofenac  sodium (VOLTAREN ) 1 % GEL Apply 1 application topically 4 (four) times daily as needed for pain.   doxazosin  (CARDURA ) 4 MG tablet Take 4 mg by mouth every evening.    famotidine  (PEPCID ) 40 MG tablet Take 40 mg by mouth at bedtime.   hyoscyamine  (LEVSIN  SL) 0.125 MG SL tablet Place 0.125 mg under the tongue 3 (three) times daily as needed (bladder spasms and GI).    Multiple Vitamin (MULTIVITAMIN WITH MINERALS) TABS tablet Take 1 tablet by mouth daily.   omeprazole (PRILOSEC) 40 MG capsule Take 40 mg by mouth 2 (two) times daily.   pregabalin  (LYRICA ) 200 MG capsule Take 200 mg by mouth 2 (two) times daily.   protein supplement shake (PREMIER PROTEIN) LIQD Take 325 mLs (11 oz total) by mouth 4 (four) times daily.   pyridostigmine  (MESTINON ) 180 MG CR tablet Take 180 mg by mouth daily.   sucralfate  (CARAFATE ) 1 g tablet Take 1 tablet by mouth at bedtime.   tadalafil (CIALIS) 20 MG tablet Take 20 mg by mouth daily as needed for erectile dysfunction.   testosterone cypionate (DEPOTESTOSTERONE CYPIONATE) 200 MG/ML injection Inject 200 mg into the muscle every 14 (fourteen) days.    ULORIC  40 MG tablet Take 40 mg by mouth daily.   [DISCONTINUED] tamsulosin  (FLOMAX ) 0.4 MG CAPS capsule Take 0.4 mg by mouth in the morning.   [DISCONTINUED] valACYclovir  (VALTREX ) 1000 MG tablet Take 1 tablet (1,000 mg total) by mouth 3 (three) times daily. (Patient not taking: Reported on 12/04/2022)   No facility-administered encounter medications on file as of 04/21/2024.    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 Attention to examination is appropriate.  Psychiatric: Patient is non-anxious.  Head:  Pupils equal, round, and reactive to light.  ENT:  Oral mucosa appears well hydrated.  Neck:   Supple.  ***Full range of motion.  Respiratory: Patient is breathing without any difficulty.  Extremities: No edema.  Vascular: Palpable dorsal pedal pulses.  Skin:   On exposed skin, there are no abnormal skin lesions.  NEUROLOGICAL:     Awake, alert, oriented to person, place, and time.  Speech is clear and fluent.  Fund of knowledge is appropriate.   Cranial Nerves: Pupils equal round and reactive to light.  Facial tone is symmetric.  Facial sensation is symmetric.  ROM of spine: ***full.  Palpation of spine: ***non tender.    + hoffman sign, 1+ rteflexes BUE   Strength: Bilateral ankle fusions  Side Biceps Triceps Deltoid Interossei Grip Wrist Ext. Wrist Flex.  R 5 5 5 5 5 5 5   L 5 5 5 5 5 5 5    Side Iliopsoas Quads Hamstring PF DF EHL  R 5 5 5 5 5 5   L 5 5 5 5 5 5    Reflexes are ***2+ and symmetric at the biceps, triceps, brachioradialis, patella and achilles.   Hoffman's is absent.  Clonus is not present.  Toes are down-going.  Bilateral upper and lower extremity sensation is intact to light touch.    Gait is normal.   No difficulty with tandem gait.   No evidence of dysmetria noted.  Medical Decision Making  Imaging: IMPRESSION: 1. Cervical spine degeneration with bulky disc and endplate degeneration at C5-C6 and C6-C7. Spinal stenosis AND spinal cord mass effect at both levels, Moderate at the latter. Mild spinal stenosis C3-C4 and C4-C5. No spinal cord signal  abnormality.   2. Associated neural foraminal stenosis is moderate at the left C3, left C4, nerve levels and severe at the right C5, right C6 and C7 nerve levels.   3. Similar upper thoracic spine degeneration with mild spinal moderate to severe neural right greater than left foraminal stenosis at T1 and T2.  Conclusions: This is an abnormal and complicated study.   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   There is evidence of a chronic lumbosacral radiculopathy.   There is evidence of moderate right median mononeuropathy at the  wrist.    Leonor Dage, MD  Neuromuscular Fellow   I have personally reviewed the images and agree with the above interpretation.  Assessment and Plan: Mr. Woolbright is a pleasant 63 y.o. male with ***  - message surgepons.    Thank you for involving me in the care of this patient.   I spent a total of *** minutes in both face-to-face and non-face-to-face activities for this visit on the date of this encounter.   Lyle Decamp, PA-C Dept. of Neurosurgery

## 2024-04-21 ENCOUNTER — Encounter: Payer: Self-pay | Admitting: Physician Assistant

## 2024-04-21 ENCOUNTER — Ambulatory Visit

## 2024-04-21 ENCOUNTER — Ambulatory Visit (INDEPENDENT_AMBULATORY_CARE_PROVIDER_SITE_OTHER): Admitting: Physician Assistant

## 2024-04-21 VITALS — BP 138/88 | Ht 67.0 in | Wt 280.0 lb

## 2024-04-21 DIAGNOSIS — M48061 Spinal stenosis, lumbar region without neurogenic claudication: Secondary | ICD-10-CM | POA: Diagnosis not present

## 2024-04-21 DIAGNOSIS — G9520 Unspecified cord compression: Secondary | ICD-10-CM | POA: Diagnosis not present

## 2024-04-21 DIAGNOSIS — M48062 Spinal stenosis, lumbar region with neurogenic claudication: Secondary | ICD-10-CM

## 2024-04-21 DIAGNOSIS — M4802 Spinal stenosis, cervical region: Secondary | ICD-10-CM | POA: Diagnosis not present

## 2024-05-08 NOTE — Progress Notes (Signed)
 Referring Physician:  Volanda Saupe, MD [address] [place],  [place] [address]  Primary Physician:  Volanda Saupe, MD  History of Present Illness: 05/13/2024 No major changes since last seeing Anthony Carlson.  Continues to have progressive weakness balance issues over the past 10 years.  Has an idiopathic severe peripheral neuropathy as well as lumbar stenosis and cervical myelopathy.  Has had a previous IVIG infusion given as weakness.  His weakness is always been more proximal than distal.  He uses a wheelchair for ambulation but is able to get up and walk a few steps if he needs to.  He has noticed some numbness in his penis as well as some difficulty with intermittent incontinence over the past multiple months.   History of Present Illness: 04/21/2024 Anthony Carlson is here today with both neck and back pain and profound progressive weakness and balance issues over the past 10 years.  He has had an extensive workup at the [facility] by neurology including a muscle biopsy and an IVIG  infusion without improvement or answers.  Questionable myasthenia gravis diagnosis.  He currently is primarily in a wheelchair at this point and ambulates very little.  He has been using a wheelchair primarily since 2016.  Patient complains of longstanding bilateral arm and leg weakness and numbness.  In addition he does have pain in his neck and low back.  His neck bothers him more than his back.  The pain in his neck shoots down bilateral upper extremities, right worse than left and is primarily in his pinky and ring finger.  Of note previous carpal tunnel release in the left hand.  He feels like his shoulders and hands are weak, shoulders worse than hands.  In his legs he feels as though the majority of his weakness is in his hips.  He states that he has had saddle anesthesia for a year and denies any incontinence.  Weakness: none Bowel/Bladder Dysfunction: none  Conservative  measures:  Physical therapy: Has not participated in recently for his  back. Multimodal medical therapy including regular antiinflammatories: prednisone, pregabalin , diclofenac , acetaminophen   Injections:  Has had epidural steroid injections in [place] 5 years ago.  Past Surgery: See below.  Anthony Carlson has  symptoms of cervical myelopathy.  The symptoms are causing a significant impact on the patient's life.   Review of Systems:  A 10 point review of systems is negative, except for the pertinent positives and negatives detailed in the HPI.  Past Medical History: Past Medical History:  Diagnosis Date   Arthritis    Chronic fatigue    Depression    GERD (gastroesophageal reflux disease)    Gout    Hypertension    IBS (irritable bowel syndrome)    Memory changes    Mental disorder    Myasthenia gravis (HCC) 02/2015   [facility] on CPAP     Past Surgical History: Past Surgical History:  Procedure Laterality Date   ankle fusions     x 2    CARPAL TUNNEL RELEASE Left    2018   CYSTOSCOPY WITH HOLMIUM LASER LITHOTRIPSY  04/2022   LAPAROSCOPIC GASTRIC SLEEVE RESECTION N/A 10/14/2017   Procedure: LAPAROSCOPIC GASTRIC SLEEVE RESECTION, UPPER ENDOSCOPY;  Surgeon: Mikell Katz, MD;  Location: [facility];  Service: General;  Laterality: N/A;   THULIUM LASER TURP (TRANSURETHRAL RESECTION OF PROSTATE)  11/02/2016   vasectomy and reversal      Allergies: Allergies as of 04/21/2024 - Review Complete 04/21/2024  Allergen Reaction Noted   Avelox [moxifloxacin hcl in nacl] Anaphylaxis 02/23/2016   Hydromorphone Other (See Comments) 10/25/2016    Medications: Outpatient Encounter Medications as of 04/21/2024  Medication Sig   acetaminophen  (TYLENOL ) 500 MG tablet Take 1,000 mg by mouth 2 (two) times daily as needed for moderate pain or headache.    albuterol  (VENTOLIN  HFA) 108 (90 Base) MCG/ACT inhaler Inhale 2 puffs into the lungs every 6 (six) hours as needed for wheezing or  shortness of breath.   amLODipine  (NORVASC ) 5 MG tablet Take 5 mg by mouth daily.   Armodafinil  250 MG tablet Take 250 mg by mouth daily.   atorvastatin  (LIPITOR) 40 MG tablet Take 40 mg by mouth at bedtime.    Azelastine -Fluticasone  137-50 MCG/ACT SUSP Place 2 sprays into the nose 2 (two) times daily as needed (allergies).   Cholecalciferol  (VITAMIN D3) 5000 units CAPS Take 5,000 Units by mouth daily.   diclofenac  sodium (VOLTAREN ) 1 % GEL Apply 1 application topically 4 (four) times daily as needed for pain.   doxazosin  (CARDURA ) 4 MG tablet Take 4 mg by mouth every evening.   famotidine  (PEPCID ) 40 MG tablet Take 40 mg by mouth at bedtime.   hyoscyamine  (LEVSIN  SL) 0.125 MG SL tablet Place 0.125 mg under the tongue 3 (three) times daily as needed (bladder spasms and GI).    Multiple Vitamin (MULTIVITAMIN WITH MINERALS) TABS tablet Take 1 tablet by mouth daily.   omeprazole (PRILOSEC) 40 MG capsule Take 40 mg by mouth 2 (two) times daily.   pregabalin  (LYRICA ) 200 MG capsule Take 200 mg by mouth 2 (two) times daily.   protein supplement shake (PREMIER PROTEIN) LIQD Take 325 mLs (11 oz total) by mouth 4 (four) times daily.   pyridostigmine  (MESTINON ) 180 MG CR tablet Take 180 mg by mouth daily.   sucralfate  (CARAFATE ) 1 g tablet Take 1 tablet by mouth at bedtime.   tadalafil (CIALIS) 20 MG tablet Take 20 mg by mouth daily as needed for erectile dysfunction.   testosterone cypionate (DEPOTESTOSTERONE CYPIONATE) 200 MG/ML injection Inject 200 mg into the muscle every 14 (fourteen) days.    ULORIC  40 MG tablet Take 40 mg by mouth daily.   [DISCONTINUED] tamsulosin  (FLOMAX ) 0.4 MG CAPS capsule Take 0.4 mg by mouth in the morning.   [DISCONTINUED] valACYclovir  (VALTREX ) 1000 MG tablet Take 1 tablet (1,000 mg total) by mouth 3 (three) times daily. (Patient not taking: Reported on 12/04/2022)   No facility-administered encounter medications on file as of 04/21/2024.    Social History: Social  History   Tobacco Use   Smoking status: Former    Current packs/day: 0.00    Average packs/day: 2.0 packs/day for 14.0 years (28.0 ttl pk-yrs)    Types: Cigarettes    Start date: 59    Quit date: 1988    Years since quitting: 37.8   Smokeless tobacco: Never  Vaping Use   Vaping status: Never Used  Substance Use Topics   Alcohol use: No    Comment: Stop drinking 1987   Drug use: Not Currently    Comment: 2008 recovery from substance abuse  Patient previously worked as a advice worker.  Family Medical History: Family History  Problem Relation Age of Onset   Alcoholism Mother    Drug abuse Sister    Drug abuse Sister    Severe combined immunodeficiency Maternal Uncle    Suicidality Maternal Uncle    COPD Other    Diabetes Other    Cancer  Other    Hypertension Other     Physical Examination: NEUROLOGICAL:     Awake, alert, oriented to person, place, and time.  Speech is clear and fluent. Fund of knowledge is appropriate.   Cranial Nerves: Pupils equal round and reactive to light.  Facial tone is symmetric.  ROM of spine:   Strength: Patient has some deltoid weakness and intrinsic hand weakness.  Bicep and tricep remain strong  Patient has some weakness with hip flexion bilaterally.  Distally strength seems okay.  Difficult to fully assess in wheelchair.  He also has bilateral ankle fusions which make testing dorsiflexion plantarflexion more difficult.  1+ throughout bilateral uppers.  Difficult to obtain lower extremity reflexes secondary to body habitus.  Cannot appreciate ankle clonus however patient does have bilateral ankle effusions.  His gait was not witnessed as patient is in a wheelchair.  Medical Decision Making  Imaging: IMPRESSION: 1. Cervical spine degeneration with bulky disc and endplate degeneration at C5-C6 and C6-C7. Spinal stenosis AND spinal cord mass effect at both levels, Moderate at the latter. Mild spinal stenosis C3-C4 and C4-C5. No  spinal cord signal abnormality.   2. Associated neural foraminal stenosis is moderate at the left C3, left C4, nerve levels and severe at the right C5, right C6 and C7 nerve levels.   3. Similar upper thoracic spine degeneration with mild spinal moderate to severe neural right greater than left foraminal stenosis at T1 and T2.  Conclusions: This is an abnormal and complicated study.   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   There is evidence of a chronic lumbosacral radiculopathy.   There is evidence of moderate right median mononeuropathy at the  wrist.    Leonor Dage, MD  Neuromuscular Fellow   I have personally reviewed the images and agree with the above interpretation.  Assessment and Plan: Mr. Rennert is a pleasant 63 year old man with a history of severe polyneuropathy, possible myopathy, and an undiagnosed neurologic disorder which he previously needed a round of IVIG for.  He has had progressive decline over the past 10 years.  He is coming in today with significant cervical stenosis as well as lumbar stenosis.  On physical examination he does not show overt myelopathy but does have some concerning symptoms and his MRI does show significant stenosis.  He however has been in the wheelchair for the past 10 years using this as his main mode of ambulation.  It is also quite difficult to know his functional restrictions are from his severe polyneuropathy myopathy and neurologic disorders versus his cervical stenosis.  We had a long talk about treating either cervical stenosis or lumbar stenosis with decompressive surgery given his symptomatology.  Given the fact that his numbness in his penis has been present for 5 to 6 months and that his intermittent bowel incontinence has been happening as that long as well unlikely to give a major recovery with the decompressive surgery however would still be an indication to go  forward with a decompression.  He is uncomfortable with moving forward with spinal decompression either in the cervical spine or lumbar spine as he has had many patients who have had poor outcomes from either of these procedures, his mother also had a significant number of complications after her spine surgeries as well.  Regardless he would like to hold off on moving forward with any cervical or lumbar intervention understanding that he may progress and get worse without that.  Will plan to have him come back in 6 months to check in and discuss if he's gotten to the point where the benefits outweigh the risks for him.   Thank you for involving me in the care of this patient.   Penne LELON Sharps MD Dept. of Neurosurgery

## 2024-05-13 ENCOUNTER — Encounter: Payer: Self-pay | Admitting: Neurosurgery

## 2024-05-13 ENCOUNTER — Ambulatory Visit (INDEPENDENT_AMBULATORY_CARE_PROVIDER_SITE_OTHER): Admitting: Neurosurgery

## 2024-05-13 VITALS — Ht 67.0 in | Wt 280.0 lb

## 2024-05-13 DIAGNOSIS — M48061 Spinal stenosis, lumbar region without neurogenic claudication: Secondary | ICD-10-CM

## 2024-05-13 DIAGNOSIS — M4802 Spinal stenosis, cervical region: Secondary | ICD-10-CM | POA: Diagnosis not present

## 2024-05-13 DIAGNOSIS — M48062 Spinal stenosis, lumbar region with neurogenic claudication: Secondary | ICD-10-CM

## 2024-05-22 ENCOUNTER — Encounter (INDEPENDENT_AMBULATORY_CARE_PROVIDER_SITE_OTHER): Payer: Self-pay

## 2024-06-01 ENCOUNTER — Ambulatory Visit: Admitting: Neurosurgery

## 2024-07-01 ENCOUNTER — Encounter (INDEPENDENT_AMBULATORY_CARE_PROVIDER_SITE_OTHER): Payer: Self-pay | Admitting: Otolaryngology

## 2024-07-01 ENCOUNTER — Ambulatory Visit (INDEPENDENT_AMBULATORY_CARE_PROVIDER_SITE_OTHER): Admitting: Otolaryngology

## 2024-07-01 VITALS — BP 146/87 | HR 73 | Temp 97.9°F | Ht 67.0 in | Wt 270.0 lb

## 2024-07-01 DIAGNOSIS — J329 Chronic sinusitis, unspecified: Secondary | ICD-10-CM

## 2024-07-01 DIAGNOSIS — R430 Anosmia: Secondary | ICD-10-CM

## 2024-07-01 MED ORDER — AMOXICILLIN-POT CLAVULANATE 875-125 MG PO TABS: 1.0000 | ORAL_TABLET | Freq: Two times a day (BID) | ORAL | 0 refills | Status: AC
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
## 2024-07-29 ENCOUNTER — Ambulatory Visit (INDEPENDENT_AMBULATORY_CARE_PROVIDER_SITE_OTHER): Admitting: Otolaryngology

## 2024-11-10 ENCOUNTER — Ambulatory Visit: Admitting: Physician Assistant

## 2024-11-11 ENCOUNTER — Ambulatory Visit: Admitting: Physician Assistant
